# Patient Record
Sex: Female | Born: 1955 | Race: White | Hispanic: No | Marital: Married | Smoking: Never smoker
Health system: Southern US, Community
[De-identification: ages and names within clinical notes are randomized; demographics above are authoritative.]

## PROBLEM LIST (undated history)

## (undated) DIAGNOSIS — L509 Urticaria, unspecified: Secondary | ICD-10-CM

## (undated) DIAGNOSIS — L309 Dermatitis, unspecified: Secondary | ICD-10-CM

## (undated) DIAGNOSIS — E0500 Thyrotoxicosis with diffuse goiter without thyrotoxic crisis or storm: Secondary | ICD-10-CM

## (undated) HISTORY — DX: Dermatitis, unspecified: L30.9

## (undated) HISTORY — DX: Urticaria, unspecified: L50.9

## (undated) HISTORY — DX: Thyrotoxicosis with diffuse goiter without thyrotoxic crisis or storm: E05.00

## (undated) HISTORY — PX: TONSILLECTOMY: SUR1361

---

## 2017-01-23 ENCOUNTER — Encounter: Payer: Self-pay | Admitting: Allergy & Immunology

## 2017-01-23 ENCOUNTER — Ambulatory Visit (INDEPENDENT_AMBULATORY_CARE_PROVIDER_SITE_OTHER): Payer: 59 | Admitting: Allergy & Immunology

## 2017-01-23 VITALS — BP 124/70 | HR 72 | Temp 98.2°F | Resp 16 | Ht 62.0 in | Wt 157.0 lb

## 2017-01-23 DIAGNOSIS — K2960 Other gastritis without bleeding: Secondary | ICD-10-CM

## 2017-01-23 DIAGNOSIS — T39395A Adverse effect of other nonsteroidal anti-inflammatory drugs [NSAID], initial encounter: Secondary | ICD-10-CM

## 2017-01-23 DIAGNOSIS — E0500 Thyrotoxicosis with diffuse goiter without thyrotoxic crisis or storm: Secondary | ICD-10-CM

## 2017-01-23 DIAGNOSIS — K219 Gastro-esophageal reflux disease without esophagitis: Secondary | ICD-10-CM

## 2017-01-23 DIAGNOSIS — E559 Vitamin D deficiency, unspecified: Secondary | ICD-10-CM | POA: Diagnosis not present

## 2017-01-23 DIAGNOSIS — J302 Other seasonal allergic rhinitis: Secondary | ICD-10-CM

## 2017-01-23 DIAGNOSIS — D802 Selective deficiency of immunoglobulin A [IgA]: Secondary | ICD-10-CM | POA: Insufficient documentation

## 2017-01-23 NOTE — Patient Instructions (Addendum)
1. IgA deficiency - We will some labs to check your immune system: IgG, IgA, IgM, Strep pneumoniae titers, Diphtheria / Tetanus titers, Haemophilius influenzae B titers. - We will get blood work to look for evidence of environmental allergies. - We will get thyroid studies as well.  - Most patients with environmental IgA deficiency have no symptoms whatsoever.  2. Return in about 1 year (around 01/23/2018) or if symptoms worsen or fail to improve.   Please inform us of any Emergency Department visits, hospitalizations, or changes in symptoms. Call us before going to the ED for breathing or allergy symptoms since we might be able to fit you in for a sick visit. Feel free to contact us anytime with any questions, problems, or concerns.  It was a pleasure to meet you today! Enjoy the upcoming fall season!  Websites that have reliable patient information: 1. American Academy of Asthma, Allergy, and Immunology: www.aaaai.org 2. Food Allergy Research and Education (FARE): foodallergy.org 3. Mothers of Asthmatics: http://www.asthmacommunitynetwork.org 4. American College of Allergy, Asthma, and Immunology: www.acaai.org   Election Day is coming up on Tuesday, November 6th! Make your voice heard! Register to vote at vote.org!

## 2017-01-23 NOTE — Progress Notes (Signed)
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 IgA deficiency - We will some labs to check your immune system: IgG, IgA, IgM, Strep pneumoniae titers, Diphtheria/Tetanus titers, Haemophilius influenzae B titers. - We will get blood work to look for evidence of environmental allergies. - We will get thyroid studies as well.  - Most patients with environmental IgA deficiency have no symptoms whatsoever. - They are more slightly prone to autoimmunity, and Debbie Owens certainly fits the bill.   - She does have some symptoms of mild seasonal allergies, however she declined testing today since this was such a minor problem.   2. Return in about 1 year (around 01/23/2018) or if symptoms worsen or fail to improve.  Subjective:   Debbie Owens is a 61 y.o. female presenting today for evaluation of  Chief Complaint  Patient presents with  . Immunoglobulin Deficiency    Immunoglobulin A is low, per Dr. Docia Owens, her PCP. Patient does have diagnosis of graves' disease.   . Throat Clearing    constantly clears her throat. ?post nasal?    Debbie Owens has a history of the following: Patient Active Problem List   Diagnosis Date Noted  . 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 Seasonal allergic rhinitis 01/25/2017  . Graves disease 01/25/2017  . Vitamin D deficiency 01/25/2017  . Gastroesophageal reflux disease 01/25/2017  . IgA deficiency (HCC) 01/23/2017    History obtained from: chart review and patient.  Debbie Owens was referred by Debbie Bussing, MD.     Debbie Owens is a 61 y.o. female presenting for an evaluation of selective IgA deficiency.  She was  diagnosed with Grave's disease when she was 61 years old. She has undergone radioactive treatments for this. She has been on thyroid replacement since that time. One year ago in November 2017, she started having GI problems with diarrhea and stomach pain. She was on Mobic for a period of time due to plantar fasciitis. She was sent to see a gastroenterologist for a possible colonoscopy. She did have some blood work to rule out Celiac disease, which was negative. As part of the panel, the IgA was drawn which was absent (IgA level was <5 on 10/05/16 and 11/30/16). She did have a biopsy of her colon and small intestine, which was all negative. There was a polyp removed. She did have some gastritis, which was thought to be secondary to the Mobic. She stopped the Mobic and then started taking Prilosec. Her stomach is still having some intermittent "issues", but she is not sure whether this is food related.   Of note, she did see Debbie Owens at [facility] because of her history of , who felt that she only needed to follow up as needed. She does have a history of alopecia, but this has been only occurring during the recent problems with titrating her thyroid medication.   She does not have a history of sinus infections. She does have a history of recurrent Strep throat, which improved after a tonsillectomy at age 67. She has had no pneumonias, skin infections, brain infections, or other concerning infections. She does have allergic rhinitis symptoms during the fall. She will get a sneezing  fit as well as some congestion intermittently. She does not take anything regularly for this. She will use Flonase occasionally to treat bilateral frontal sinus pain, but otherwise no routine use of nasal sprays.   Otherwise, there is no history of other atopic diseases, including asthma, drug allergies, food allergies, environmental allergies, stinging insect allergies, or urticaria. There is no significant  infectious history. Vaccinations are up to date.    Past Medical History: Patient Active Problem List   Diagnosis Date Noted  . 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 Seasonal allergic rhinitis 01/25/2017  . Graves disease 01/25/2017  . Vitamin D deficiency 01/25/2017  . Gastroesophageal reflux disease 01/25/2017  . IgA deficiency (HCC) 01/23/2017    Medication List:  Allergies as of 01/23/2017      Reactions   Codeine Hives      Medication List       Accurate as of 01/23/17 11:59 PM. Always use your most recent med list.          BIOTIN PO Take by mouth.   cholecalciferol 1000 units tablet Commonly known as:  VITAMIN D Take 1,000 Units by mouth daily.   omeprazole 20 MG tablet Commonly known as:  PRILOSEC OTC Take 20 mg by mouth daily.   REFRESH OPTIVE OP 1 drop 3 (three) times daily as needed.   SYNTHROID 50 MCG tablet Generic drug:  levothyroxine            Discharge Care Instructions        Start     Ordered   01/23/17 0000  IgG, IgA, IgM     01/23/17 1514   01/23/17 0000  Strep pneumoniae 23 Serotypes IgG     01/23/17 1514   01/23/17 0000  Diphtheria / Tetanus Antibody Panel     01/23/17 1514   01/23/17 0000  Haemophilius influenzae B Ab IgG     01/23/17 1514   01/23/17 0000  Thyroid Panel With TSH     01/23/17 1514   01/23/17 0000  IgE     01/23/17 1514   01/23/17 0000  Allergens Zone 2     01/23/17 1514      Birth History: non-contributory.  Developmental History: non-contributory.   Past Surgical History: Past Surgical History:  Procedure Laterality Date  . TONSILLECTOMY    . VAGINAL DELIVERY     3x     Family History: Family History  Problem Relation Age of Onset  . Diabetes Mother   . Thyroid disease Mother   . Atrial fibrillation Mother   . COPD Father   . Hyperlipidemia Father   . Diabetes Brother   . Hypertension Brother      Social History: Debbie Owens lives at home with her  husband. Her husband is a retired Emergency planning/management officer and she works currently as a Clinical biochemist. She did live in [place] and moved back to [place] 2.5 years ago. 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 She has been working there for one year. They live in a 59yo home with hardwoods in the main living areas and rugs in the bedroom. They have gas heating and central cooling. There is a cat in the home. She is unsure whether there are dust mite coverings on the bedding. There is no tobacco exposure and she has never smoked .    Review of Systems: a 14-point review of systems is pertinent for what  is mentioned in HPI.  Otherwise, all other systems were negative. Constitutional: negative other than that listed in the HPI Eyes: negative other than that listed in the HPI Ears, nose, mouth, throat, and face: negative other than that listed in the HPI Respiratory: negative other than that listed in the HPI Cardiovascular: negative other than that listed in the HPI Gastrointestinal: negative other than that listed in the HPI Genitourinary: negative other than that listed in the HPI Integument: negative other than that listed in the HPI Hematologic: negative other than that listed in the HPI Musculoskeletal: negative other than that listed in the HPI Neurological: negative other than that listed in the HPI Allergy/Immunologic: negative other than that listed in the HPI    Objective:   Blood pressure 124/70, pulse 72, temperature 98.2 F (36.8 C), temperature source Oral, resp. rate 16, height  (1.575 m), weight 157 lb (71.2 kg), SpO2 99 %. Body mass index is 28.72 kg/m.   Physical Exam:  General: Alert, interactive, in no acute distress. Pleasant interactive female.  Eyes: No conjunctival injection present on the right, No conjunctival injection present on the left, PERRL bilaterally, No discharge on the right, No  discharge on the left and No Horner-Trantas dots present Ears: Right TM pearly gray with normal light reflex, Left TM pearly gray with normal light reflex, Right TM intact without perforation and Left TM intact without perforation.  Nose/Throat: External nose within normal limits and septum midline, turbinates edematous and pale with clear discharge, post-pharynx erythematous without cobblestoning in the posterior oropharynx. Tonsils 2+ without exudates Neck: Supple without thyromegaly.  Adenopathy: shoddy bilateral anterior cervical lymphadenopathy. and no enlarged lymph nodes appreciated in the occipital, axillary, epitrochlear, inguinal, or popliteal regions. Lungs: Clear to auscultation without wheezing, rhonchi or rales. No increased work of breathing. CV: Normal S1/S2, no murmurs. Capillary refill <2 seconds.  Abdomen: Nondistended, nontender. No guarding or rebound tenderness. Bowel sounds present in all fields and hyperactive  Skin: Warm and dry, without lesions or rashes. Extremities:  No clubbing, cyanosis or edema. Neuro:   Grossly intact. No focal deficits appreciated. Responsive to questions.  Diagnostic studies: none      Malachi Bonds, MD [facility]

## 2017-01-25 DIAGNOSIS — E0500 Thyrotoxicosis with diffuse goiter without thyrotoxic crisis or storm: Secondary | ICD-10-CM | POA: Insufficient documentation

## 2017-01-25 DIAGNOSIS — K2960 Other gastritis without bleeding: Secondary | ICD-10-CM | POA: Insufficient documentation

## 2017-01-25 DIAGNOSIS — T39395A Adverse effect of other nonsteroidal anti-inflammatory drugs [NSAID], initial encounter: Secondary | ICD-10-CM

## 2017-01-25 DIAGNOSIS — E559 Vitamin D deficiency, unspecified: Secondary | ICD-10-CM | POA: Insufficient documentation

## 2017-01-25 DIAGNOSIS — J302 Other seasonal allergic rhinitis: Secondary | ICD-10-CM | POA: Insufficient documentation

## 2017-01-25 DIAGNOSIS — K219 Gastro-esophageal reflux disease without esophagitis: Secondary | ICD-10-CM | POA: Insufficient documentation

## 2017-01-27 LAB — IGE+ALLERGENS ZONE 2(30)
Alternaria Alternata IgE: <0.1 kU/L
Bermuda Grass IgE: <0.1 kU/L
Cedar, Mountain IgE: <0.1 kU/L
Cockroach, American IgE: <0.1 kU/L
Common Silver Birch IgE: <0.1 kU/L
D Pteronyssinus IgE: <0.1 kU/L
Dog Dander IgE: <0.1 kU/L
IGE (IMMUNOGLOBULIN E), SERUM: 6 [IU]/mL (ref 0–100)
Johnson Grass IgE: <0.1 kU/L
Maple/Box Elder IgE: <0.1 kU/L
Mucor Racemosus IgE: <0.1 kU/L
Mugwort IgE Qn: <0.1 kU/L
Oak, White IgE: <0.1 kU/L
Plantain, English IgE: <0.1 kU/L
Ragweed, Short IgE: 1.38 kU/L — AB
Sweet gum IgE RAST Ql: <0.1 kU/L
Timothy Grass IgE: <0.1 kU/L

## 2017-01-27 LAB — STREP PNEUMONIAE 23 SEROTYPES IGG
PNEUMO AB TYPE 17 (17F): 0.1 ug/mL — AB (ref >1.3)
PNEUMO AB TYPE 34 (10A): 1.4 ug/mL (ref >1.3)
PNEUMO AB TYPE 4: 0.4 ug/mL — AB (ref >1.3)
PNEUMO AB TYPE 54 (15B): 0.2 ug/mL — AB (ref >1.3)
PNEUMO AB TYPE 68 (9V): 0.6 ug/mL — AB (ref >1.3)
PNEUMO AB TYPE 70 (33F): 0.4 ug/mL — AB (ref >1.3)
PNEUMO AB TYPE 8: 0.1 ug/mL — AB (ref >1.3)
Pneumo Ab Type 1*: 2.3 ug/mL (ref >1.3)
Pneumo Ab Type 12 (12F)*: <0.1 ug/mL — ABNORMAL LOW (ref >1.3)
Pneumo Ab Type 14*: 0.2 ug/mL — ABNORMAL LOW (ref >1.3)
Pneumo Ab Type 19 (19F)*: 0.3 ug/mL — ABNORMAL LOW (ref >1.3)
Pneumo Ab Type 2*: <0.1 ug/mL — ABNORMAL LOW (ref >1.3)
Pneumo Ab Type 20*: 0.4 ug/mL — ABNORMAL LOW (ref >1.3)
Pneumo Ab Type 23 (23F)*: <0.1 ug/mL — ABNORMAL LOW (ref >1.3)
Pneumo Ab Type 3*: <0.1 ug/mL — ABNORMAL LOW (ref >1.3)
Pneumo Ab Type 5*: <0.2 ug/mL — ABNORMAL LOW (ref >1.3)
Pneumo Ab Type 51 (7F)*: <0.1 ug/mL — ABNORMAL LOW (ref >1.3)
Pneumo Ab Type 56 (18C)*: <0.1 ug/mL — ABNORMAL LOW (ref >1.3)
Pneumo Ab Type 57 (19A)*: 0.9 ug/mL — ABNORMAL LOW (ref >1.3)

## 2017-01-27 LAB — THYROID PANEL WITH TSH
FREE THYROXINE INDEX: 2.4 (ref 1.2–4.9)
T3 UPTAKE RATIO: 27 % (ref 24–39)
T4 TOTAL: 8.9 ug/dL (ref 4.5–12.0)
TSH: 1.17 u[IU]/mL (ref 0.450–4.500)

## 2017-01-27 LAB — HAEMOPHILIUS INFLUENZAE B AB IGG: INFLUENZA B VIRUS AB, IGG: 0.61 ug/mL

## 2017-01-27 LAB — DIPHTHERIA / TETANUS ANTIBODY PANEL
Diphtheria Ab: 0.21 IU/mL (ref <0.10)
Tetanus Ab, IgG: 4.3 IU/mL (ref <0.10)

## 2017-01-27 LAB — IGG, IGA, IGM
IGM (IMMUNOGLOBULIN M), SRM: 57 mg/dL (ref 26–217)
IgA/Immunoglobulin A, Serum: <5 mg/dL — ABNORMAL LOW (ref 87–352)
IgG (Immunoglobin G), Serum: 1678 mg/dL — ABNORMAL HIGH (ref 700–1600)

## 2017-01-30 ENCOUNTER — Telehealth: Payer: Self-pay | Admitting: Allergy & Immunology

## 2017-01-30 NOTE — Telephone Encounter (Signed)
Dr. Gallagher please advise.  

## 2017-01-30 NOTE — Telephone Encounter (Signed)
Patient has had some labs drawn She is wondering if any part of them have come back - she was told that some results may take longer than others She is concerned about her thyroid  Please call to answer any questions

## 2017-01-30 NOTE — Telephone Encounter (Signed)
Dr. Docia Chuck at [facility] @ [place]. [phone]

## 2017-01-30 NOTE — Telephone Encounter (Signed)
I called patient on mobile number. Advised her that all results are not back yet and the doctor has not released his notes yet. I told her that as soon as this is done we will call her to notify her of results and any recommendations he has. She did want me to make a note that her thyroid panel would need to be sent to her pcp at [facility]. I will find his name and contact info to attach.

## 2017-04-28 ENCOUNTER — Encounter (HOSPITAL_COMMUNITY): Payer: Self-pay | Admitting: Emergency Medicine

## 2017-04-28 ENCOUNTER — Emergency Department (HOSPITAL_COMMUNITY): Payer: BLUE CROSS/BLUE SHIELD

## 2017-04-28 ENCOUNTER — Other Ambulatory Visit: Payer: Self-pay

## 2017-04-28 DIAGNOSIS — R05 Cough: Secondary | ICD-10-CM | POA: Insufficient documentation

## 2017-04-28 DIAGNOSIS — R531 Weakness: Secondary | ICD-10-CM | POA: Insufficient documentation

## 2017-04-28 DIAGNOSIS — Z5321 Procedure and treatment not carried out due to patient leaving prior to being seen by health care provider: Secondary | ICD-10-CM | POA: Diagnosis not present

## 2017-04-28 DIAGNOSIS — R079 Chest pain, unspecified: Secondary | ICD-10-CM | POA: Diagnosis present

## 2017-04-28 LAB — COMPREHENSIVE METABOLIC PANEL
ALBUMIN: 3.8 g/dL (ref 3.5–5.0)
ALT: 20 U/L (ref 14–54)
ANION GAP: 11 (ref 5–15)
AST: 26 U/L (ref 15–41)
Alkaline Phosphatase: 85 U/L (ref 38–126)
BUN: 9 mg/dL (ref 6–20)
CHLORIDE: 104 mmol/L (ref 101–111)
CO2: 25 mmol/L (ref 22–32)
Calcium: 9 mg/dL (ref 8.9–10.3)
Creatinine, Ser: 0.71 mg/dL (ref 0.44–1.00)
GFR calc non Af Amer: >60 mL/min (ref >60)
Glucose, Bld: 116 mg/dL — ABNORMAL HIGH (ref 65–99)
POTASSIUM: 3.7 mmol/L (ref 3.5–5.1)
SODIUM: 140 mmol/L (ref 135–145)
Total Bilirubin: 0.4 mg/dL (ref 0.3–1.2)
Total Protein: 7.5 g/dL (ref 6.5–8.1)

## 2017-04-28 LAB — CBC WITH DIFFERENTIAL/PLATELET
BASOS PCT: 0 %
Basophils Absolute: 0 10*3/uL (ref 0.0–0.1)
EOS ABS: 0.3 10*3/uL (ref 0.0–0.7)
EOS PCT: 4 %
HCT: 40.9 % (ref 36.0–46.0)
Hemoglobin: 13.1 g/dL (ref 12.0–15.0)
LYMPHS ABS: 1.8 10*3/uL (ref 0.7–4.0)
Lymphocytes Relative: 20 %
MCH: 29.6 pg (ref 26.0–34.0)
MCHC: 32 g/dL (ref 30.0–36.0)
MCV: 92.3 fL (ref 78.0–100.0)
MONOS PCT: 6 %
Monocytes Absolute: 0.5 10*3/uL (ref 0.1–1.0)
Neutro Abs: 6 10*3/uL (ref 1.7–7.7)
Neutrophils Relative %: 70 %
PLATELETS: 238 10*3/uL (ref 150–400)
RBC: 4.43 MIL/uL (ref 3.87–5.11)
RDW: 13.7 % (ref 11.5–15.5)
WBC: 8.6 10*3/uL (ref 4.0–10.5)

## 2017-04-28 LAB — I-STAT TROPONIN, ED: TROPONIN I, POC: 0 ng/mL (ref 0.00–0.08)

## 2017-04-28 NOTE — ED Triage Notes (Signed)
Patient presents with multiple complaints : chest congestion , chest pressure/pain worse with deep inspiration , productive cough ,mild SOB , nasal congestion , right ear ache and right upper back pain , fever/chills onset last week .

## 2017-04-29 ENCOUNTER — Emergency Department (HOSPITAL_COMMUNITY)
Admission: EM | Admit: 2017-04-29 | Discharge: 2017-04-29 | Disposition: A | Discharge status: Home / Self Care | Payer: BLUE CROSS/BLUE SHIELD | Attending: Emergency Medicine | Admitting: Emergency Medicine

## 2017-04-29 NOTE — ED Notes (Signed)
LWBS 

## 2017-06-23 ENCOUNTER — Other Ambulatory Visit: Payer: Self-pay | Admitting: Family Medicine

## 2017-06-23 DIAGNOSIS — R109 Unspecified abdominal pain: Secondary | ICD-10-CM

## 2017-07-10 ENCOUNTER — Ambulatory Visit
Admission: RE | Admit: 2017-07-10 | Discharge: 2017-07-10 | Disposition: A | Discharge status: Home / Self Care | Payer: No Typology Code available for payment source | Source: Ambulatory Visit | Attending: Family Medicine | Admitting: Family Medicine

## 2017-07-10 DIAGNOSIS — R109 Unspecified abdominal pain: Secondary | ICD-10-CM

## 2017-07-24 ENCOUNTER — Ambulatory Visit
Admission: RE | Admit: 2017-07-24 | Discharge: 2017-07-24 | Disposition: A | Discharge status: Home / Self Care | Payer: No Typology Code available for payment source | Source: Ambulatory Visit | Attending: Family Medicine | Admitting: Family Medicine

## 2017-07-24 ENCOUNTER — Other Ambulatory Visit: Payer: Self-pay | Admitting: Family Medicine

## 2017-07-24 DIAGNOSIS — R52 Pain, unspecified: Secondary | ICD-10-CM

## 2020-01-26 IMAGING — CT CT ABD-PELV W/O CM
1 of 2 series · 14 of 32 positions shown, 19 images · non-contrast
Comparison: None.

CLINICAL DATA: 61-year-old female with right lower quadrant
pain/right upper flank pain. No history cancer. Initial encounter.

EXAM:
CT ABDOMEN AND PELVIS WITHOUT CONTRAST
TECHNIQUE: Multidetector CT imaging of the abdomen and pelvis was performed
following the standard protocol without IV contrast.

[Series 2: renal standard/full · axial · 0.72mm/px · z∈[-319,+46]mm · 14 of 83 slices shown, 19 images]
[im 5/83  soft-tissue]
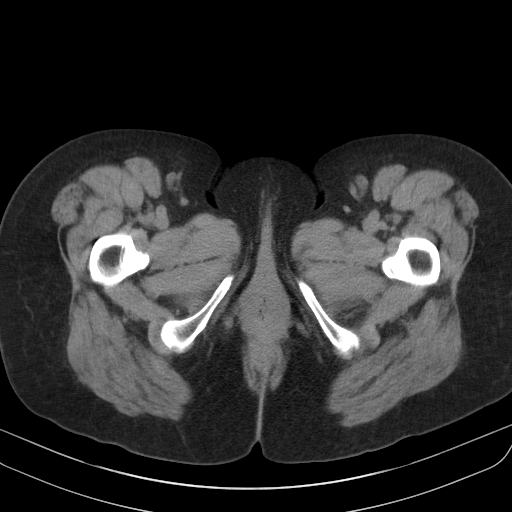
[im 5/83  bone]
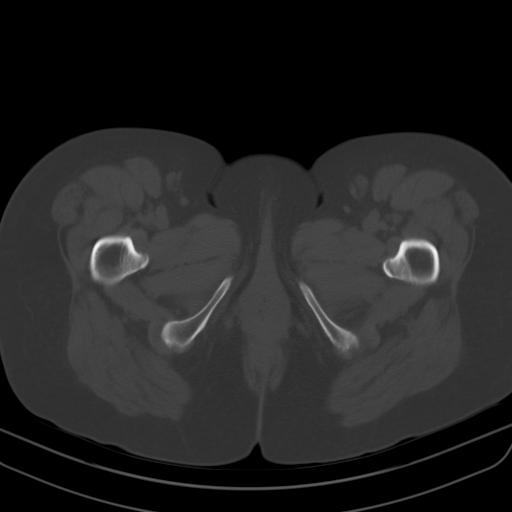
[im 13/83  soft-tissue]
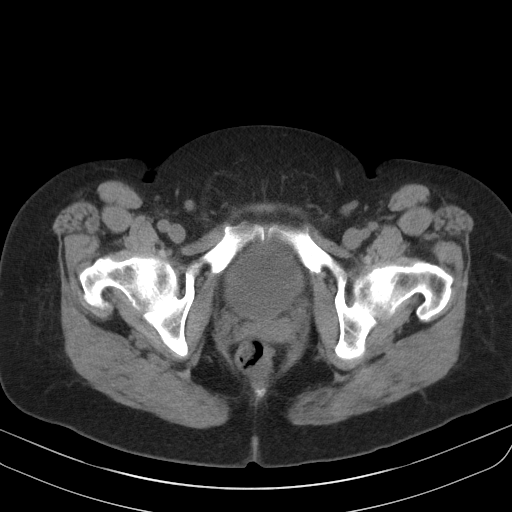
[im 17/83  soft-tissue]
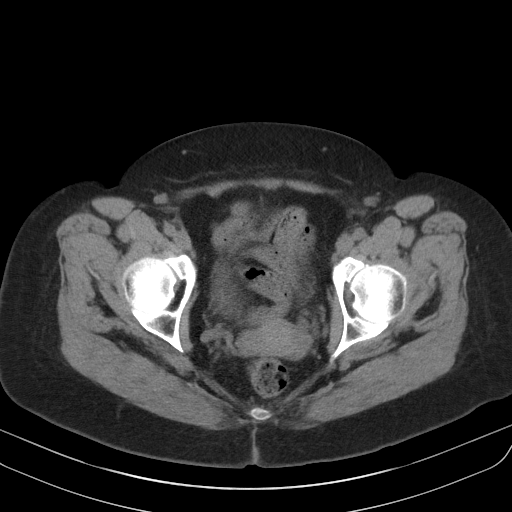
[im 25/83  soft-tissue]
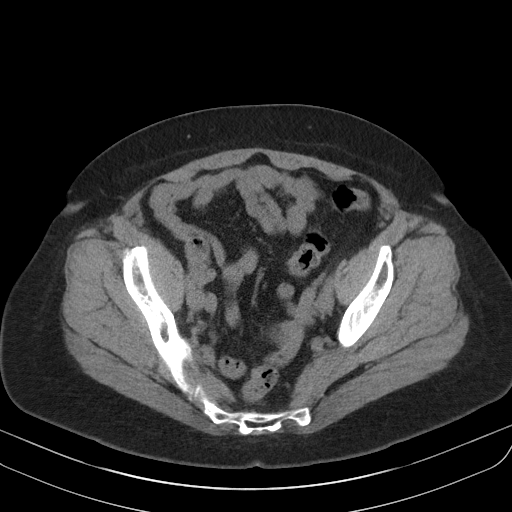
[im 29/83  soft-tissue]
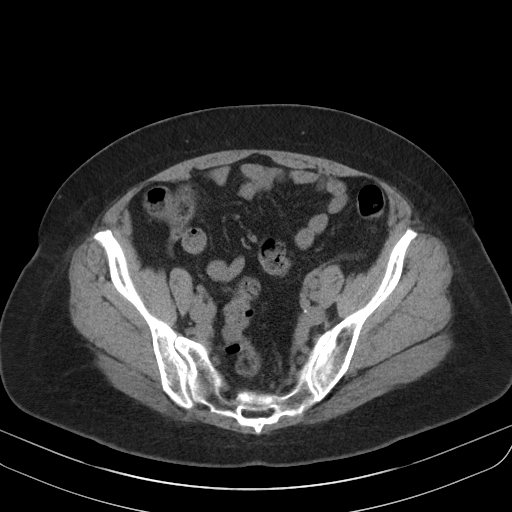
[im 37/83  soft-tissue]
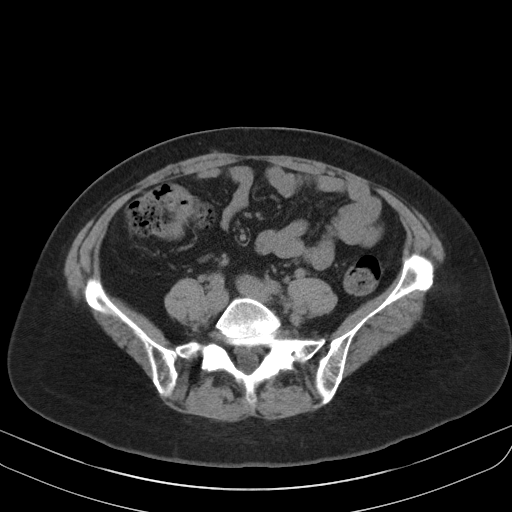
[im 42/83  soft-tissue]
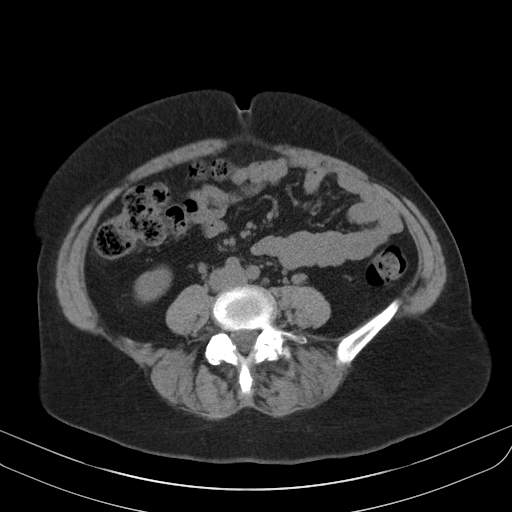
[im 46/83  soft-tissue]
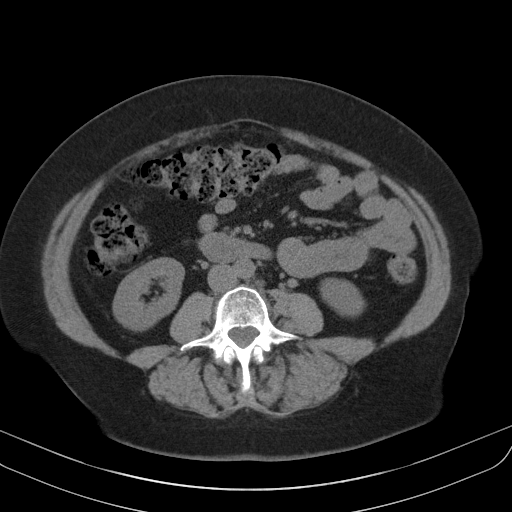
[im 54/83  soft-tissue]
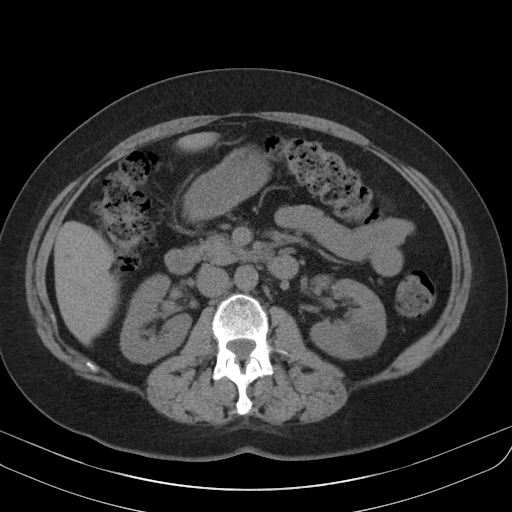
[im 54/83  bone]
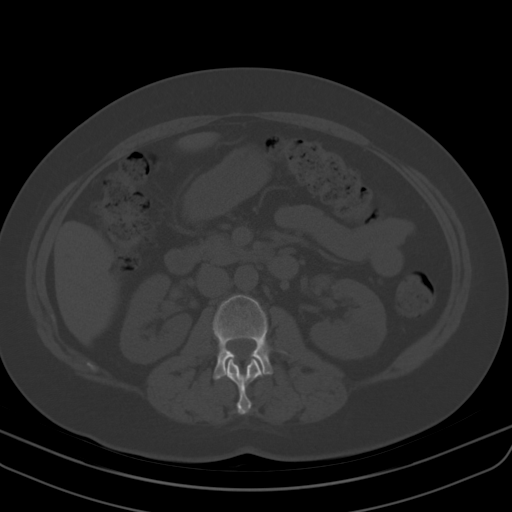
[im 58/83  soft-tissue]
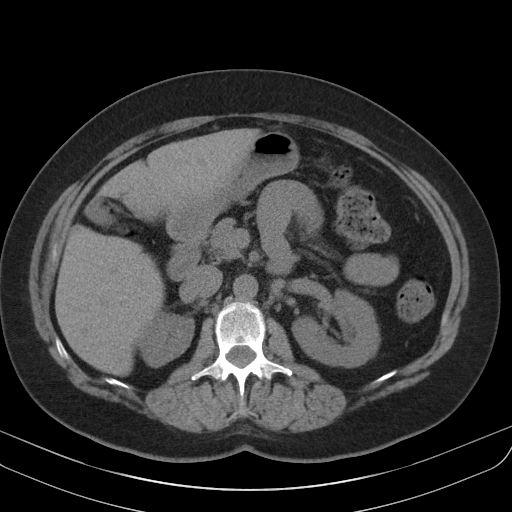
[im 66/83  soft-tissue]
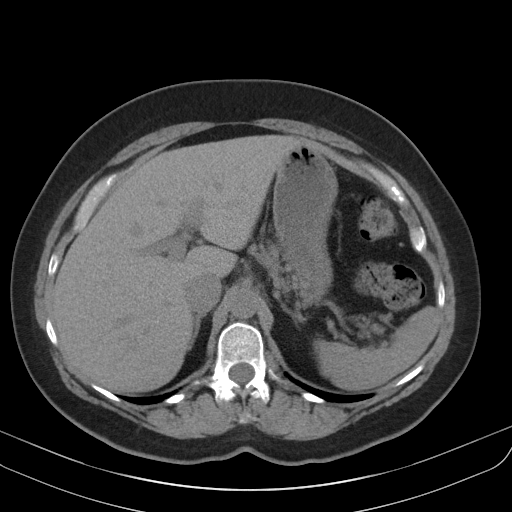
[im 66/83  lung]
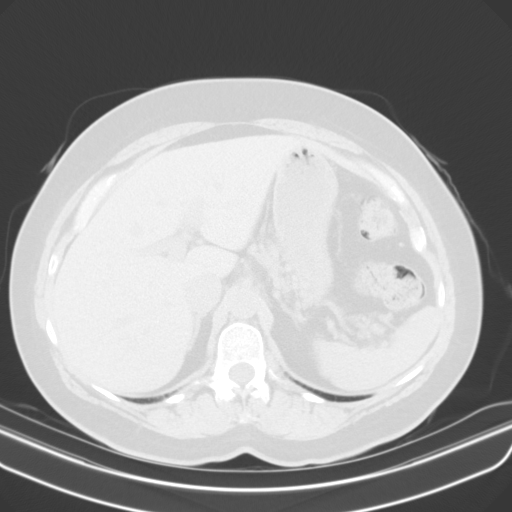
[im 70/83  soft-tissue]
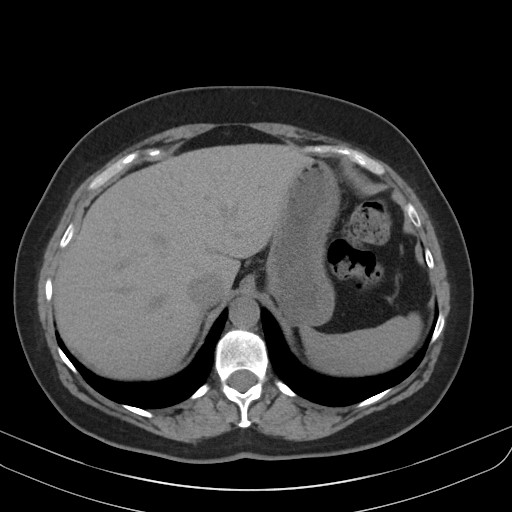
[im 70/83  lung]
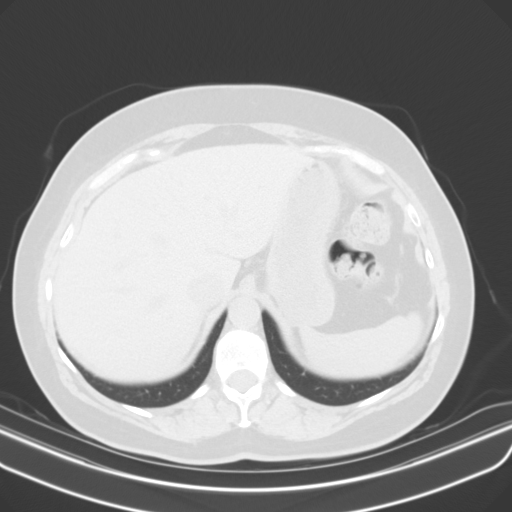
[im 74/83  lung]
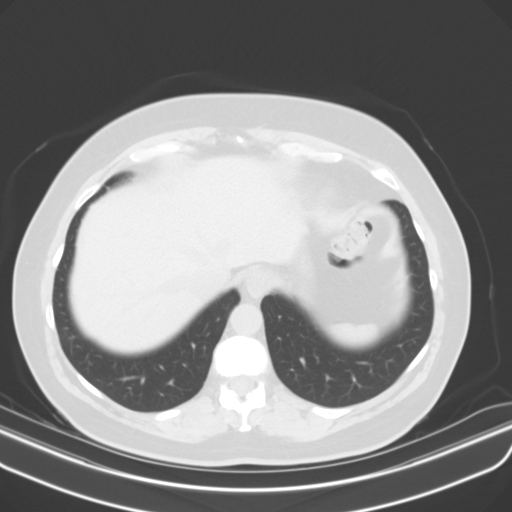
[im 78/83  soft-tissue]
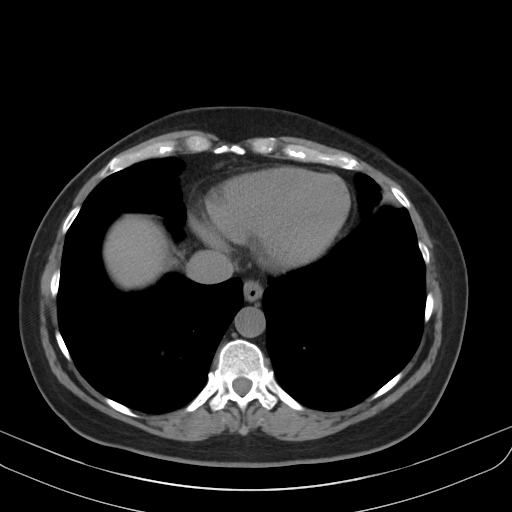
[im 78/83  lung]
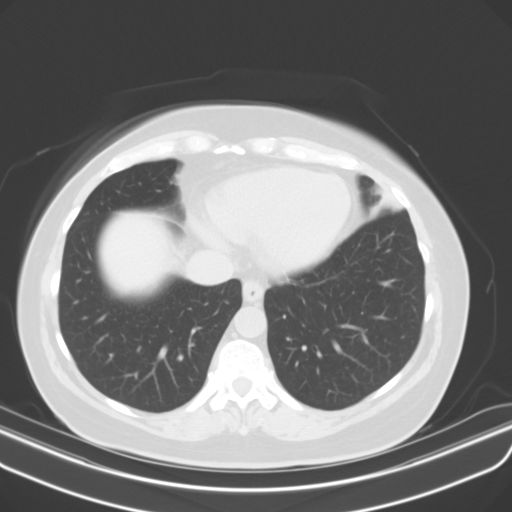

[14 of 32 positions shown; findings below may reference images not displayed]

FINDINGS: Lower chest: Minimal scarring lung bases. Heart size within normal
limits. Question very small hiatal hernia.

Hepatobiliary: Taking into account limitation by non contrast
imaging, no worrisome hepatic lesion. Contracted gallbladder.

Pancreas: Taking into account limitation by non contrast imaging, no
worrisome pancreatic mass or inflammation.

Spleen: Taking into account limitation by non contrast imaging, no
splenic mass or enlargement.

Adrenals/Urinary Tract: No obstructing stone or hydronephrosis.
cm left renal cyst. Taking into account limitation by non contrast
imaging, no worrisome renal or adrenal mass.

Partially contracted urinary bladder.  No gross abnormality noted.

Stomach/Bowel: No bowel inflammatory process or obvious mass noted.
Specifically, no inflammation surrounds the appendix or terminal
ileum.

Vascular/Lymphatic: No aortic aneurysm.

Scattered small lymph nodes including small lymph nodes right lower
quadrant region and retroperitoneal region without adenopathy.

Reproductive: No worrisome adnexal or uterine abnormality.

Other: No free air or bowel containing hernia.

Musculoskeletal: L5-S1 facet degenerative changes greater on left.
Mild right sacroiliac joint degenerative changes.
IMPRESSION: No abnormality detected as cause of patient's right lower quadrant
pain/right flank pain.

Contracted gallbladder.

2.8 cm left renal cyst.

L5-S1 facet degenerative changes greater on left.

Mild right sacroiliac joint degenerative changes.

## 2020-02-09 IMAGING — CR DG HIP (WITH OR WITHOUT PELVIS) 2-3V*R*
2 series · 2 of 2 positions shown · non-contrast
Comparison: 07/10/2017 CT.

CLINICAL DATA: 61-year-old female with right groin pain for the
past month. Initial encounter.

EXAM:
DG HIP (WITH OR WITHOUT PELVIS) 2-3V RIGHT

[w hip ap right]
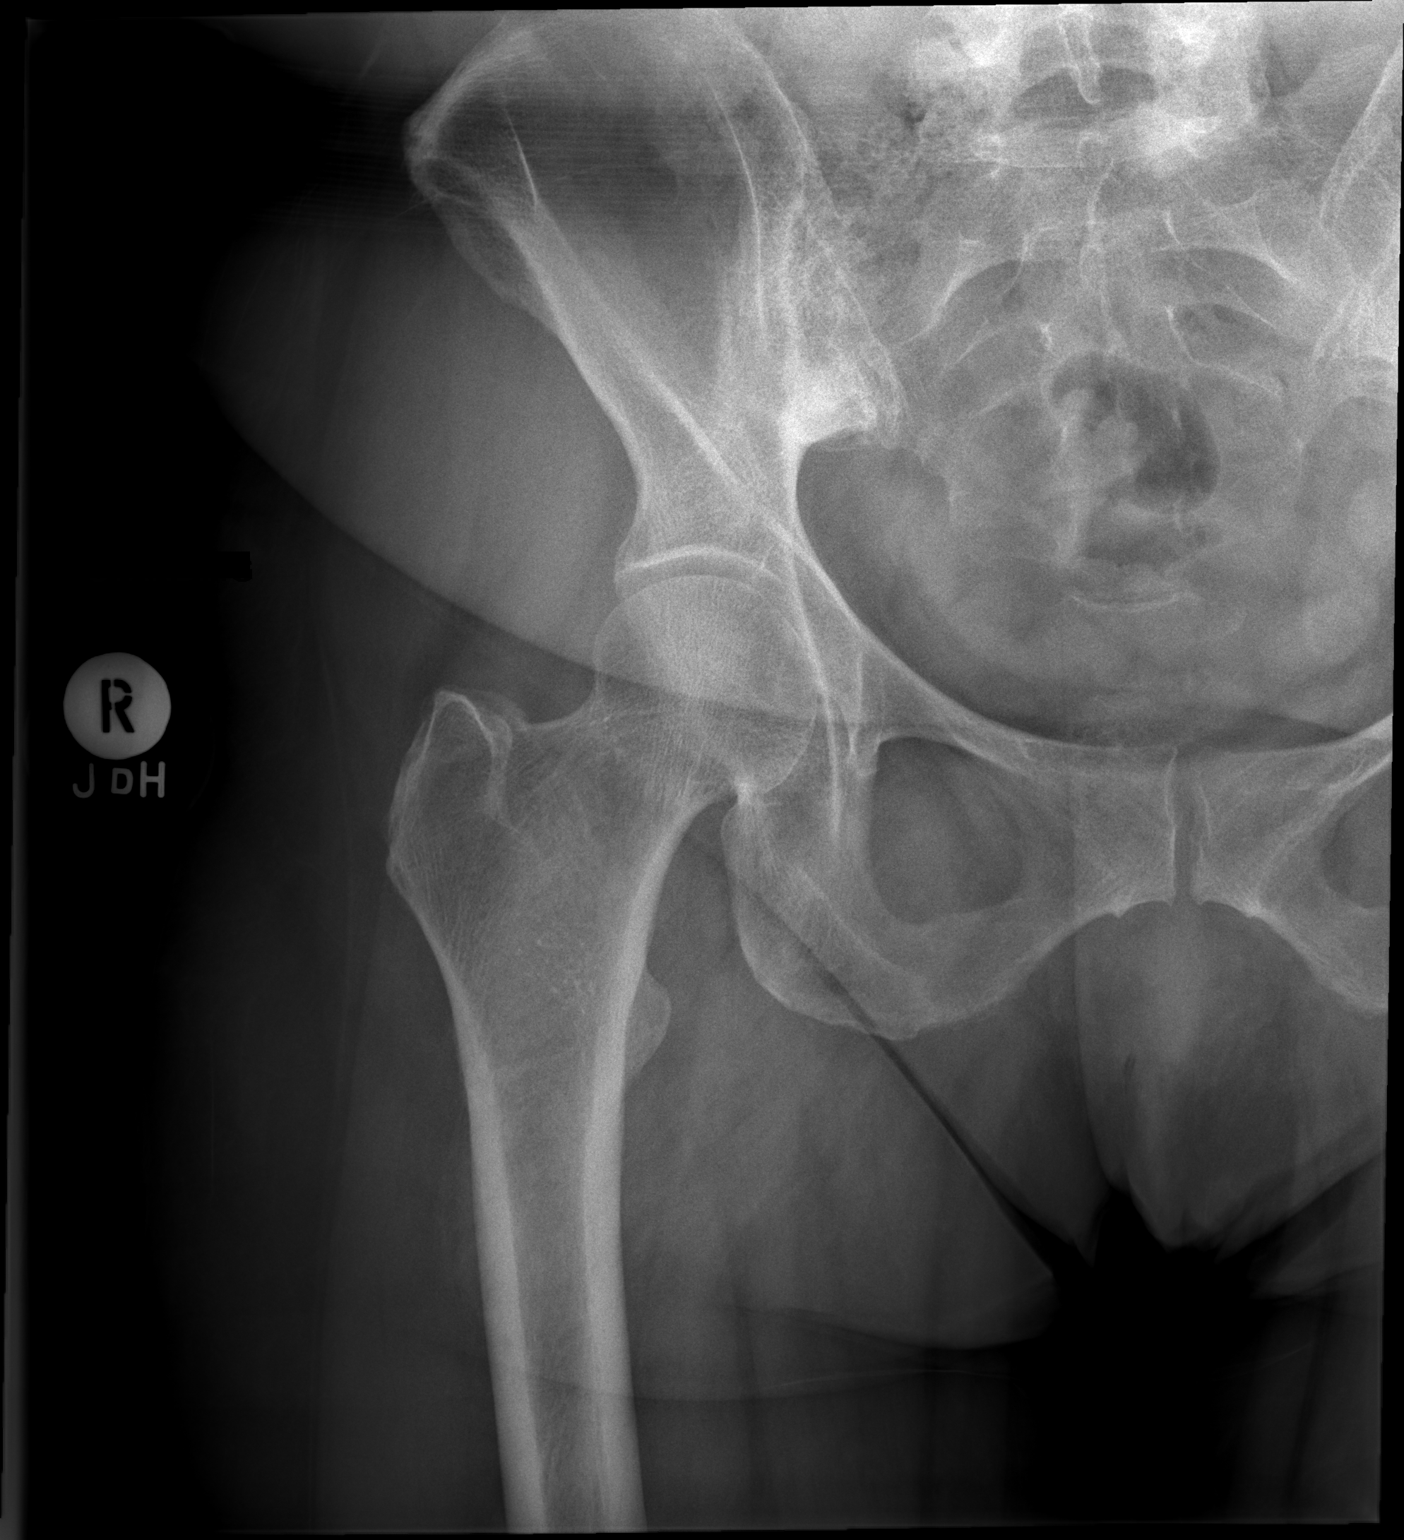

[w hip frog right]
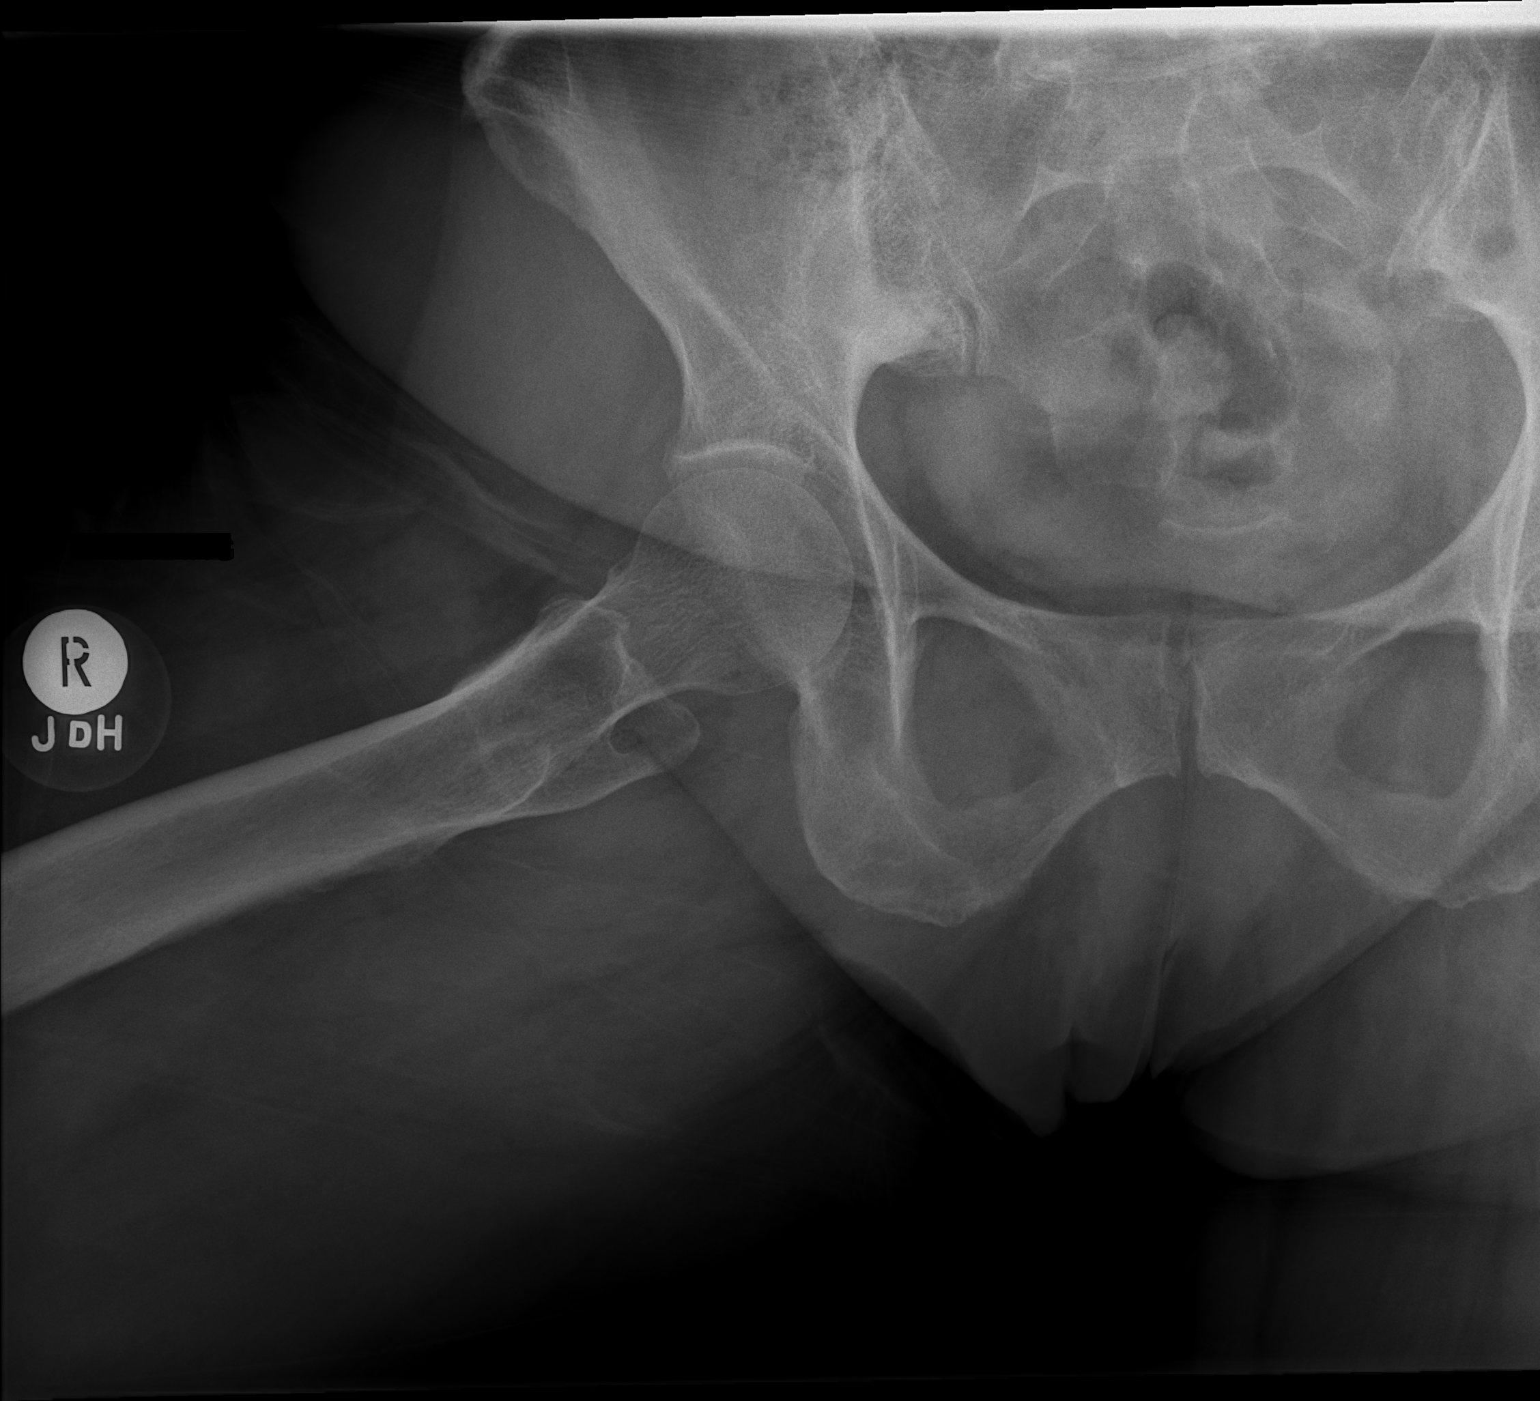

[2 of 2 positions shown; findings below may reference images not displayed]

FINDINGS: No evidence of right hip joint degenerative changes, femoral head
avascular necrosis, fracture or dislocation.

Sclerotic appearance right ilium suggestive of mild right sacroiliac
joint degenerative changes.
IMPRESSION: Mild right sacroiliac joint degenerative changes.

Right hip unremarkable.

## 2020-03-12 ENCOUNTER — Other Ambulatory Visit (HOSPITAL_COMMUNITY): Payer: Self-pay | Admitting: Family Medicine

## 2020-03-12 DIAGNOSIS — R0602 Shortness of breath: Secondary | ICD-10-CM

## 2020-04-03 ENCOUNTER — Other Ambulatory Visit: Payer: Self-pay

## 2020-04-03 ENCOUNTER — Ambulatory Visit (HOSPITAL_COMMUNITY): Payer: BLUE CROSS/BLUE SHIELD | Attending: Cardiovascular Disease

## 2020-04-03 DIAGNOSIS — R0602 Shortness of breath: Secondary | ICD-10-CM | POA: Diagnosis present

## 2020-04-03 LAB — ECHOCARDIOGRAM COMPLETE
Area-P 1/2: 3.31 cm2
S' Lateral: 2.9 cm

## 2021-12-31 ENCOUNTER — Ambulatory Visit: Payer: No Typology Code available for payment source | Admitting: Podiatry

## 2022-01-10 ENCOUNTER — Encounter: Payer: Self-pay | Admitting: Podiatry

## 2022-01-10 ENCOUNTER — Ambulatory Visit: Payer: No Typology Code available for payment source | Admitting: Podiatry

## 2022-01-10 DIAGNOSIS — L603 Nail dystrophy: Secondary | ICD-10-CM

## 2022-01-10 NOTE — Progress Notes (Signed)
  Subjective:  Patient ID: Debbie Owens, female    DOB: 1955/09/01,   MRN: [id number]  Chief Complaint  Patient presents with   Nail Problem    Bil great toenail thick. Patient wants to know if there is anything that she can do to improve the look of her Great toenails. Left toenail came off about 5 weeks ago.    66 y.o. female presents for concern of bilateral thick toenails mostly the great toenails. Wondering what can be done. Denies treatments. Relates the left toenail cam off about 5 weeks ago. Relates trauma in the past to the nails.  . Denies any other pedal complaints. Denies n/v/f/c.   Past Medical History:  Diagnosis Date   Eczema    Graves disease    Urticaria     Objective:  Physical Exam: Vascular: DP/PT pulses 2/4 bilateral. CFT <3 seconds. Normal hair growth on digits. No edema.  Skin. No lacerations or abrasions bilateral feet. Bilateral hallux nails are thickened and discolored with splitting noted on the left.  Musculoskeletal: MMT 5/5 bilateral lower extremities in DF, PF, Inversion and Eversion. Deceased ROM in DF of ankle joint.  Neurological: Sensation intact to light touch.   Assessment:   1. Onychodystrophy      Plan:  Patient was evaluated and treated and all questions answered. -Examined patient -Discussed treatment options for painful dystrophic nails  -Clinical picture and Fungal culture was obtained by removing a portion of the hard nail itself from each of the involved toenails using a sterile nail nipper and sent to [facility] lab. Patient tolerated the biopsy procedure well without discomfort or need for anesthesia.  -Discussed use of urea nail gel.  -Patient to return in 4 weeks for follow up evaluation and discussion of fungal culture results or sooner if symptoms worsen.   Louann Sjogren, DPM

## 2022-01-17 ENCOUNTER — Telehealth: Payer: Self-pay | Admitting: *Deleted

## 2022-01-17 NOTE — Telephone Encounter (Signed)
Debbie Owens is asking for the requisition form for patient's nail samples that were sent in 01/10/22. Please advise/fax to : [phone].

## 2022-01-20 ENCOUNTER — Other Ambulatory Visit: Payer: Self-pay

## 2022-01-20 ENCOUNTER — Emergency Department (HOSPITAL_BASED_OUTPATIENT_CLINIC_OR_DEPARTMENT_OTHER)
Admission: EM | Admit: 2022-01-20 | Discharge: 2022-01-20 | Disposition: A | Discharge status: Home / Self Care | Payer: BLUE CROSS/BLUE SHIELD | Attending: Emergency Medicine | Admitting: Emergency Medicine

## 2022-01-20 ENCOUNTER — Other Ambulatory Visit (HOSPITAL_BASED_OUTPATIENT_CLINIC_OR_DEPARTMENT_OTHER): Payer: Self-pay

## 2022-01-20 ENCOUNTER — Emergency Department (HOSPITAL_BASED_OUTPATIENT_CLINIC_OR_DEPARTMENT_OTHER): Payer: BLUE CROSS/BLUE SHIELD

## 2022-01-20 ENCOUNTER — Encounter (HOSPITAL_BASED_OUTPATIENT_CLINIC_OR_DEPARTMENT_OTHER): Payer: Self-pay

## 2022-01-20 DIAGNOSIS — R079 Chest pain, unspecified: Secondary | ICD-10-CM

## 2022-01-20 DIAGNOSIS — R072 Precordial pain: Secondary | ICD-10-CM | POA: Diagnosis present

## 2022-01-20 DIAGNOSIS — R799 Abnormal finding of blood chemistry, unspecified: Secondary | ICD-10-CM | POA: Insufficient documentation

## 2022-01-20 LAB — BASIC METABOLIC PANEL
Anion gap: 7 (ref 5–15)
BUN: 17 mg/dL (ref 8–23)
CO2: 27 mmol/L (ref 22–32)
Calcium: 10.4 mg/dL — ABNORMAL HIGH (ref 8.9–10.3)
Chloride: 107 mmol/L (ref 98–111)
Creatinine, Ser: 0.73 mg/dL (ref 0.44–1.00)
GFR, Estimated: >60 mL/min (ref >60)
Glucose, Bld: 91 mg/dL (ref 70–99)
Potassium: 4.2 mmol/L (ref 3.5–5.1)
Sodium: 141 mmol/L (ref 135–145)

## 2022-01-20 LAB — CBC
HCT: 42.4 % (ref 36.0–46.0)
Hemoglobin: 14.1 g/dL (ref 12.0–15.0)
MCH: 30.4 pg (ref 26.0–34.0)
MCHC: 33.3 g/dL (ref 30.0–36.0)
MCV: 91.4 fL (ref 80.0–100.0)
Platelets: 221 10*3/uL (ref 150–400)
RBC: 4.64 MIL/uL (ref 3.87–5.11)
RDW: 13.1 % (ref 11.5–15.5)
WBC: 5.1 10*3/uL (ref 4.0–10.5)
nRBC: 0 % (ref 0.0–0.2)

## 2022-01-20 LAB — TROPONIN I (HIGH SENSITIVITY)
Troponin I (High Sensitivity): <2 ng/L (ref <18)
Troponin I (High Sensitivity): <2 ng/L (ref <18)

## 2022-01-20 LAB — D-DIMER, QUANTITATIVE: D-Dimer, Quant: 0.34 ug/mL-FEU (ref 0.00–0.50)

## 2022-01-20 LAB — CBG MONITORING, ED: Glucose-Capillary: 91 mg/dL (ref 70–99)

## 2022-01-20 MED ORDER — NAPROXEN 375 MG PO TABS
375.0000 mg | ORAL_TABLET | Freq: Two times a day (BID) | ORAL | 0 refills | Status: AC
  Filled 2022-01-20: qty 20, 10d supply, fill #0

## 2022-01-20 MED ORDER — ASPIRIN 81 MG PO CHEW
324.0000 mg | CHEWABLE_TABLET | Freq: Once | ORAL | Status: AC
  Administered 2022-01-20: 324 mg via ORAL
  Filled 2022-01-20: qty 4

## 2022-01-20 NOTE — ED Triage Notes (Signed)
Pt has had left shoulder pain for a while, had seen orthopedist, who gave her exercises to do. Pt states that did not complete resolve the pain, and now pain goes to her left breast. Pt states the stabbing chest pain started last night. Denies SHOB.

## 2022-01-20 NOTE — ED Provider Notes (Signed)
[place] EMERGENCY DEPT Provider Note   CSN: [id number] Arrival date & time: 01/20/22  1006     History  Chief Complaint  Patient presents with   Chest Pain    Debbie Owens is a 66 y.o. female.   Chest Pain Pain location:  Substernal area Pain quality: sharp and stabbing   Pain radiates to:  L arm Pain severity:  Severe Onset quality:  Sudden Duration: several weeks, worse last couple of days, especially last night. Timing:  Intermittent Context: not breathing, not movement and not raising an arm   Worsened by:  Nothing Ineffective treatments: took a course of steroids from orthopedist, helped initially but sx returned. Associated symptoms: no abdominal pain, no cough, no fever and no shortness of breath   Risk factors: no aortic disease, no coronary artery disease, no diabetes mellitus, no high cholesterol, no hypertension and no prior DVT/PE        Home Medications Prior to Admission medications   Medication Sig Start Date End Date Taking? Authorizing Provider  naproxen (NAPROSYN) 375 MG tablet Take 1 tablet (375 mg total) by mouth 2 (two) times daily. 01/20/22  Yes Dorie Rank, MD  BIOTIN PO Take by mouth.    [provider]  Carboxymethylcellul-Glycerin (REFRESH OPTIVE OP) 1 drop 3 (three) times daily as needed.    [provider]  cholecalciferol (VITAMIN D) 1000 units tablet Take 1,000 Units by mouth daily.    [provider]  omeprazole (PRILOSEC OTC) 20 MG tablet Take 20 mg by mouth daily.    [provider]  SYNTHROID 50 MCG tablet  10/19/16   [provider]  SYNTHROID 75 MCG tablet Take 75 mcg by mouth daily. 01/17/22   [provider]      Allergies    Codeine and Celexa [citalopram]    Review of Systems   Review of Systems  Constitutional:  Negative for fever.  Respiratory:  Negative for cough and shortness of breath.   Cardiovascular:  Positive for chest pain.  Gastrointestinal:   Negative for abdominal pain.    Physical Exam Updated Vital Signs BP 121/84   Pulse 73   Temp 98.3 F (36.8 C) (Oral)   Resp 18   Ht 1.575 m (5\' 2" )   Wt 69.4 kg   SpO2 97%   BMI 27.98 kg/m  Physical Exam Vitals and nursing note reviewed.  Constitutional:      General: She is not in acute distress.    Appearance: She is well-developed.  HENT:     Head: Normocephalic and atraumatic.     Right Ear: External ear normal.     Left Ear: External ear normal.  Eyes:     General: No scleral icterus.       Right eye: No discharge.        Left eye: No discharge.     Conjunctiva/sclera: Conjunctivae normal.  Neck:     Trachea: No tracheal deviation.  Cardiovascular:     Rate and Rhythm: Normal rate and regular rhythm.  Pulmonary:     Effort: Pulmonary effort is normal. No respiratory distress.     Breath sounds: Normal breath sounds. No stridor. No wheezing or rales.  Abdominal:     General: Bowel sounds are normal. There is no distension.     Palpations: Abdomen is soft.     Tenderness: There is no abdominal tenderness. There is no guarding or rebound.  Musculoskeletal:        General:  No tenderness or deformity.     Cervical back: Neck supple.  Skin:    General: Skin is warm and dry.     Findings: No rash.  Neurological:     General: No focal deficit present.     Mental Status: She is alert.     Cranial Nerves: No cranial nerve deficit (no facial droop, extraocular movements intact, no slurred speech).     Sensory: No sensory deficit.     Motor: No abnormal muscle tone or seizure activity.     Coordination: Coordination normal.  Psychiatric:        Mood and Affect: Mood normal.     ED Results / Procedures / Treatments   Labs (all labs ordered are listed, but only abnormal results are displayed) Labs Reviewed  BASIC METABOLIC PANEL - Abnormal; Notable for the following components:      Result Value   Calcium 10.4 (*)    All other components within normal limits   CBC  D-DIMER, QUANTITATIVE  CBG MONITORING, ED  TROPONIN I (HIGH SENSITIVITY)  TROPONIN I (HIGH SENSITIVITY)    EKG EKG Interpretation  Date/Time:  Thursday January 20 2022 10:12:58 EDT Ventricular Rate:  77 PR Interval:  185 QRS Duration: 80 QT Interval:  361 QTC Calculation: 409 R Axis:   76 Text Interpretation: Sinus rhythm No significant change since last tracing Confirmed by Linwood Dibbles [phone]) on 01/20/2022 10:18:32 AM  Radiology DG Chest Portable 1 View  Result Date: 01/20/2022 CLINICAL DATA:  Sharp chest pain since last night EXAM: PORTABLE CHEST 1 VIEW COMPARISON:  04/20/2017 FINDINGS: Cardiac and mediastinal contours are within normal limits. No focal pulmonary opacity. No pleural effusion or pneumothorax. No acute osseous abnormality. IMPRESSION: No acute cardiopulmonary process. Electronically Signed   By: Wiliam Ke M.D.   On: 01/20/2022 11:01    Procedures Procedures    Medications Ordered in ED Medications  aspirin chewable tablet 324 mg (324 mg Oral Given 01/20/22 1044)    ED Course/ Medical Decision Making/ A&P Clinical Course as of 01/20/22 1308  Thu Jan 20, 2022  1105 CXR negative for acute process [JK]  1131 D-dimer, quantitative [JK]  1131 D-dimer normal [JK]  1304 Troponin I (High Sensitivity) Serial troponins normal [JK]    Clinical Course User Index [JK] Linwood Dibbles, MD           HEART Score: 1                Medical Decision Making Differential diagnosis includes but not limited to acute coronary syndrome, pericarditis, pulmonary embolism, musculoskeletal etiology  Amount and/or Complexity of Data Reviewed Labs: ordered. Decision-making details documented in ED Course. Radiology: ordered.  Risk OTC drugs.   Patient presented to the ED with intermittent sharp chest pain.  ED work-up reassuring.  No signs of pneumonia or pneumothorax on x-ray.  D-dimer negative.  Low risk for PE.  Doubt pulmonary embolism.  Patient low risk heart  score with normal serial troponins and reassuring EKG.  Doubt acute coronary syndrome.  ED work-up overall reassuring.  Unclear etiology of her symptoms but no signs of any emergency medical condition.  Appears stable for discharge.  Will refer to cardiology for outpatient eval        Final Clinical Impression(s) / ED Diagnoses Final diagnoses:  Chest pain, unspecified type    Rx / DC Orders ED Discharge Orders          Ordered    Ambulatory referral to Cardiology  Comments: If you have not heard from the Cardiology office within the next 72 hours please call [phone].   01/20/22 1308    naproxen (NAPROSYN) 375 MG tablet  2 times daily        01/20/22 1308              Linwood Dibbles, MD 01/20/22 1308

## 2022-01-20 NOTE — Discharge Instructions (Signed)
Take the medications as prescribed to help with pain and discomfort.  Follow-up with a cardiologist for further evaluation.  The office should call you to schedule an appointment.

## 2022-02-02 ENCOUNTER — Encounter: Payer: Self-pay | Admitting: Podiatry

## 2022-02-10 DIAGNOSIS — R079 Chest pain, unspecified: Secondary | ICD-10-CM | POA: Insufficient documentation

## 2022-02-10 NOTE — Progress Notes (Signed)
Cardiology Office Note   Date:  02/11/2022   ID:  Debbie Owens, DOB 1955-12-29, MRN [id number]  PCP:  Lujean Amel, MD  Cardiologist:   None Referring:  Lujean Amel, MD  No chief complaint on file.     History of Present Illness: Debbie Owens is a 66 y.o. female who presents for evaluation of chest pain.   She is referred by Lujean Amel, MD.  The patient was in the ED on 9/21 with chest pain.  I reviewed these records for this visit.    There was no objective evidence of ischemia.  D-dimer was normal.  She had no past cardiac history other than being told that she had a murmur and mitral valve prolapse in the past.  Echo in 2021 demonstrated a normal EF with no wall motion abnormalities or valve abnormalities.    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  Has been noticing some chest discomfort.  This was off and on.  The evening before presentation it was more persistent.  It would come and go.  It would be sharp and shooting and then linger with some discomfort on the left arm.  This was throughout the night.  It was 5 out of 10 in intensity.  She describes it as a "ache jolt."  There was no associated nausea vomiting diaphoresis.  She is not short of breath.  She has not had PND or orthopnea.  She does not really describe palpitations and has not had any presyncope or syncope.  She is active in her job and cannot bring the symptoms on.  They seem to occur at rest.   Past Medical History:  Diagnosis Date   Eczema    Graves disease    Urticaria     Past Surgical History:  Procedure Laterality Date   TONSILLECTOMY     VAGINAL DELIVERY     3x     Current Outpatient Medications  Medication Sig Dispense Refill   Calcium Carb-Cholecalciferol (CALCIUM 1000 + D PO) Take by mouth.     cetirizine (ZYRTEC) 10 MG tablet Take by mouth as needed.     cholecalciferol (VITAMIN D) 1000 units tablet Take 1,000 Units by mouth daily.     fluocinonide (LIDEX) 0.05 % external  solution as needed.     fluticasone (FLONASE ALLERGY RELIEF) 50 MCG/ACT nasal spray Spray 1 spray every day by intranasal route.     ketoconazole (NIZORAL) 2 % shampoo as needed.     meloxicam (MOBIC) 15 MG tablet as needed.     methocarbamol (ROBAXIN) 500 MG tablet Take 500 mg by mouth as needed.     naproxen (NAPROSYN) 375 MG tablet Take 1 tablet (375 mg total) by mouth 2 (two) times daily. 20 tablet 0   omeprazole (PRILOSEC OTC) 20 MG tablet Take 20 mg by mouth daily.     SYNTHROID 75 MCG tablet Take 75 mcg by mouth daily.     No current facility-administered medications for this visit.    Allergies:   Codeine and Celexa [citalopram]    Social History:  The patient  reports that she has never smoked. She has been exposed to tobacco smoke. She has never used smokeless tobacco. She reports that she does not drink alcohol and does not use drugs.   Family History:  The patient's family history includes Atrial fibrillation in her mother; Bladder Cancer in her brother; COPD in her father and mother; Diabetes in her brother and mother;  Hyperlipidemia in her father; Hypertension in her brother; Liver cancer in her mother; Thyroid disease in her mother.    ROS:  Please see the history of present illness.   Otherwise, review of systems are positive for none.   All other systems are reviewed and negative.    PHYSICAL EXAM: VS:  BP 108/64   Pulse 77   Ht 5\' 3"  (1.6 m)   Wt 154 lb 6.4 oz (70 kg)   SpO2 99%   BMI 27.35 kg/m  , BMI Body mass index is 27.35 kg/m. GENERAL:  Well appearing HEENT:  Pupils equal round and reactive, fundi not visualized, oral mucosa unremarkable NECK:  No jugular venous distention, waveform within normal limits, carotid upstroke brisk and symmetric, no bruits, no thyromegaly LYMPHATICS:  No cervical, inguinal adenopathy LUNGS:  Clear to auscultation bilaterally BACK:  No CVA tenderness CHEST:  Unremarkable HEART:  PMI not displaced or sustained,S1 and S2 within  normal limits, no S3, no S4, no clicks, no rubs, no murmurs ABD:  Flat, positive bowel sounds normal in frequency in pitch, no bruits, no rebound, no guarding, no midline pulsatile mass, no hepatomegaly, no splenomegaly EXT:  2 plus pulses throughout, no edema, no cyanosis no clubbing SKIN:  No rashes no nodules NEURO:  Cranial nerves II through XII grossly intact, motor grossly intact throughout PSYCH:  Cognitively intact, oriented to person place and time    EKG:  EKG is not ordered today. The ekg ordered today demonstrates sinus rhythm, rate 77, axis within normal limits, intervals within normal limits, no acute ST-T wave changes.   Recent Labs: 01/20/2022: BUN 17; Creatinine, Ser 0.73; Hemoglobin 14.1; Platelets 221; Potassium 4.2; Sodium 141    Lipid Panel No results found for: "CHOL", "TRIG", "HDL", "CHOLHDL", "VLDL", "LDLCALC", "LDLDIRECT"    Wt Readings from Last 3 Encounters:  02/11/22 154 lb 6.4 oz (70 kg)  01/20/22 153 lb (69.4 kg)  01/23/17 157 lb (71.2 kg)      Other studies Reviewed: Additional studies/ records that were reviewed today include: ED records. Review of the above records demonstrates:  Please see elsewhere in the note.     ASSESSMENT AND PLAN:  Chest pain: The pretest probability of obstructive coronary disease I think is low.  I think the pain is predominantly nonanginal.  My plan is to screen her with a POET (Plain Old Exercise Treadmill).  I would like to also order a coronary calcium score for risk stratification.  Dyslipidemia: Her LDL was only 1 2.  HDL was 63.  Goals of therapy will be based on the above testing.  We did talk about therapeutic lifestyle changes to good exercise diet.      Current medicines are reviewed at length with the patient today.  The patient has concerns regarding medicines.  The following changes have been made:  no change  Labs/ tests ordered today include:   Orders Placed This Encounter  Procedures   CT  CARDIAC SCORING (SELF PAY ONLY)   Exercise Tolerance Test     Disposition:   FU with me as needed.      Signed, 01/25/17, MD  02/11/2022 10:51 AM    [facility]

## 2022-02-11 ENCOUNTER — Ambulatory Visit: Payer: BLUE CROSS/BLUE SHIELD | Attending: Cardiology | Admitting: Cardiology

## 2022-02-11 ENCOUNTER — Encounter: Payer: Self-pay | Admitting: Cardiology

## 2022-02-11 VITALS — BP 108/64 | HR 77 | Ht 63.0 in | Wt 154.4 lb

## 2022-02-11 DIAGNOSIS — R079 Chest pain, unspecified: Secondary | ICD-10-CM | POA: Diagnosis not present

## 2022-02-11 NOTE — Patient Instructions (Signed)
  Testing/Procedures:  Your physician has requested that you have an exercise tolerance test. For further information please visit [email or web address]. Please also follow instruction sheet, as given. [facility]   CORONARY CALCIUM SCORING CT AT THE [facility]   Follow-Up: At [facility], you and your health needs are our priority.  As part of our continuing mission to provide you with exceptional heart care, we have created designated Provider Care Teams.  These Care Teams include your primary Cardiologist (physician) and Advanced Practice Providers (APPs -  Physician Assistants and Nurse Practitioners) who all work together to provide you with the care you need, when you need it.  We recommend signing up for the patient portal called "MyChart".  Sign up information is provided on this After Visit Summary.  MyChart is used to connect with patients for Virtual Visits (Telemedicine).  Patients are able to view lab/test results, encounter notes, upcoming appointments, etc.  Non-urgent messages can be sent to your provider as well.   To learn more about what you can do with MyChart, go to [email or web address].    Your next appointment:    AS NEEDED

## 2022-02-17 ENCOUNTER — Ambulatory Visit: Payer: No Typology Code available for payment source | Admitting: Podiatry

## 2022-02-18 ENCOUNTER — Telehealth: Payer: Self-pay | Admitting: Podiatry

## 2022-02-18 ENCOUNTER — Ambulatory Visit: Payer: No Typology Code available for payment source | Admitting: Podiatry

## 2022-02-18 NOTE — Telephone Encounter (Signed)
Patient injured her knee and cant come in for her appointment she would like someone to call her back and give her the results from her nail clippings,

## 2022-02-23 NOTE — Telephone Encounter (Signed)
Return call to patient for lab results, no answer, left voice message for call back.

## 2022-02-24 ENCOUNTER — Telehealth (HOSPITAL_COMMUNITY): Payer: Self-pay | Admitting: *Deleted

## 2022-02-24 NOTE — Telephone Encounter (Signed)
Close encounter 

## 2022-02-25 ENCOUNTER — Ambulatory Visit (HOSPITAL_COMMUNITY)
Admission: RE | Admit: 2022-02-25 | Discharge: 2022-02-25 | Disposition: A | Discharge status: Home / Self Care | Payer: BLUE CROSS/BLUE SHIELD | Source: Ambulatory Visit | Attending: Cardiology | Admitting: Cardiology

## 2022-02-25 DIAGNOSIS — R079 Chest pain, unspecified: Secondary | ICD-10-CM | POA: Diagnosis present

## 2022-02-25 LAB — EXERCISE TOLERANCE TEST
Estimated workload: 10.3
Exercise duration (min): 8 min
Exercise duration (sec): 37 s
MPHR: 156 {beats}/min
Peak HR: 164 {beats}/min
Percent HR: 156 %
Rest HR: 70 {beats}/min
ST Elevation (mm): 1 mm

## 2022-02-28 ENCOUNTER — Encounter: Payer: Self-pay | Admitting: *Deleted

## 2022-02-28 ENCOUNTER — Telehealth: Payer: Self-pay | Admitting: Cardiology

## 2022-02-28 NOTE — Telephone Encounter (Signed)
Patient is calling for results to her stress test.

## 2022-02-28 NOTE — Telephone Encounter (Signed)
Returned call to patient and discussed results of ETT.  Per Dr. Percival Spanish: Negative for evidence of ischemia.  Call Debbie Owens with the results and send results to [facility], [place], MD. No further work up.    All questions were answered, patient verbalized understanding and expressed appreciation for call.

## 2022-03-03 NOTE — Telephone Encounter (Signed)
Noted, thanks!

## 2022-03-28 ENCOUNTER — Other Ambulatory Visit (HOSPITAL_BASED_OUTPATIENT_CLINIC_OR_DEPARTMENT_OTHER): Payer: No Typology Code available for payment source

## 2022-04-14 ENCOUNTER — Ambulatory Visit: Payer: No Typology Code available for payment source | Admitting: Podiatry

## 2022-05-13 ENCOUNTER — Ambulatory Visit (HOSPITAL_BASED_OUTPATIENT_CLINIC_OR_DEPARTMENT_OTHER)
Admission: RE | Admit: 2022-05-13 | Discharge: 2022-05-13 | Disposition: A | Discharge status: Home / Self Care | Payer: No Typology Code available for payment source | Source: Ambulatory Visit | Attending: Cardiology | Admitting: Cardiology

## 2022-05-13 DIAGNOSIS — R079 Chest pain, unspecified: Secondary | ICD-10-CM | POA: Insufficient documentation

## 2022-05-17 ENCOUNTER — Telehealth: Payer: Self-pay | Admitting: Cardiology

## 2022-05-17 NOTE — Telephone Encounter (Signed)
Patient wanted to know results of cardiac scoring done on 1/12. Explained that as soon as Dr. Percival Spanish reviews it, we will contact her.

## 2022-05-17 NOTE — Telephone Encounter (Signed)
Gave patient results of Ca scoring per Dr. Percival Spanish. Discussed with patient CAD risk reduction measures. Will mail handouts to her regarding diet, exercise, and stress reduction.

## 2022-05-17 NOTE — Telephone Encounter (Signed)
Good afternoon! Patient called and mentioned that she would like for someone to go over her results with her.

## 2022-05-20 ENCOUNTER — Encounter: Payer: Self-pay | Admitting: *Deleted

## 2022-08-17 ENCOUNTER — Emergency Department (HOSPITAL_BASED_OUTPATIENT_CLINIC_OR_DEPARTMENT_OTHER): Payer: BLUE CROSS/BLUE SHIELD

## 2022-08-17 ENCOUNTER — Other Ambulatory Visit: Payer: Self-pay

## 2022-08-17 ENCOUNTER — Emergency Department (HOSPITAL_BASED_OUTPATIENT_CLINIC_OR_DEPARTMENT_OTHER)
Admission: EM | Admit: 2022-08-17 | Discharge: 2022-08-17 | Disposition: A | Discharge status: Home / Self Care | Payer: BLUE CROSS/BLUE SHIELD | Attending: Emergency Medicine | Admitting: Emergency Medicine

## 2022-08-17 ENCOUNTER — Encounter (HOSPITAL_BASED_OUTPATIENT_CLINIC_OR_DEPARTMENT_OTHER): Payer: Self-pay

## 2022-08-17 DIAGNOSIS — R6883 Chills (without fever): Secondary | ICD-10-CM | POA: Diagnosis not present

## 2022-08-17 DIAGNOSIS — Z1152 Encounter for screening for COVID-19: Secondary | ICD-10-CM | POA: Insufficient documentation

## 2022-08-17 DIAGNOSIS — R300 Dysuria: Secondary | ICD-10-CM | POA: Insufficient documentation

## 2022-08-17 DIAGNOSIS — R000 Tachycardia, unspecified: Secondary | ICD-10-CM | POA: Diagnosis not present

## 2022-08-17 DIAGNOSIS — R350 Frequency of micturition: Secondary | ICD-10-CM | POA: Diagnosis present

## 2022-08-17 DIAGNOSIS — M545 Low back pain, unspecified: Secondary | ICD-10-CM | POA: Insufficient documentation

## 2022-08-17 LAB — RESP PANEL BY RT-PCR (RSV, FLU A&B, COVID)  RVPGX2
Influenza A by PCR: NEGATIVE
Influenza B by PCR: NEGATIVE
Resp Syncytial Virus by PCR: NEGATIVE
SARS Coronavirus 2 by RT PCR: NEGATIVE

## 2022-08-17 LAB — URINALYSIS, MICROSCOPIC (REFLEX)

## 2022-08-17 LAB — URINALYSIS, ROUTINE W REFLEX MICROSCOPIC
Bilirubin Urine: NEGATIVE
Glucose, UA: NEGATIVE mg/dL
Ketones, ur: NEGATIVE mg/dL
Leukocytes,Ua: NEGATIVE
Nitrite: NEGATIVE
Protein, ur: NEGATIVE mg/dL
Specific Gravity, Urine: 1.015 (ref 1.005–1.030)
pH: 7.5 (ref 5.0–8.0)

## 2022-08-17 LAB — CBC
HCT: 43.4 % (ref 36.0–46.0)
Hemoglobin: 14.6 g/dL (ref 12.0–15.0)
MCH: 30.4 pg (ref 26.0–34.0)
MCHC: 33.6 g/dL (ref 30.0–36.0)
MCV: 90.4 fL (ref 80.0–100.0)
Platelets: 225 10*3/uL (ref 150–400)
RBC: 4.8 MIL/uL (ref 3.87–5.11)
RDW: 13.2 % (ref 11.5–15.5)
WBC: 17.5 10*3/uL — ABNORMAL HIGH (ref 4.0–10.5)
nRBC: 0 % (ref 0.0–0.2)

## 2022-08-17 LAB — COMPREHENSIVE METABOLIC PANEL
ALT: 14 U/L (ref 0–44)
AST: 15 U/L (ref 15–41)
Albumin: 4.5 g/dL (ref 3.5–5.0)
Alkaline Phosphatase: 68 U/L (ref 38–126)
Anion gap: 9 (ref 5–15)
BUN: 12 mg/dL (ref 8–23)
CO2: 25 mmol/L (ref 22–32)
Calcium: 10.1 mg/dL (ref 8.9–10.3)
Chloride: 103 mmol/L (ref 98–111)
Creatinine, Ser: 0.74 mg/dL (ref 0.44–1.00)
GFR, Estimated: >60 mL/min (ref >60)
Glucose, Bld: 162 mg/dL — ABNORMAL HIGH (ref 70–99)
Potassium: 3.9 mmol/L (ref 3.5–5.1)
Sodium: 137 mmol/L (ref 135–145)
Total Bilirubin: 0.7 mg/dL (ref 0.3–1.2)
Total Protein: 7.9 g/dL (ref 6.5–8.1)

## 2022-08-17 LAB — LIPASE, BLOOD: Lipase: 14 U/L (ref 11–51)

## 2022-08-17 LAB — LACTIC ACID, PLASMA: Lactic Acid, Venous: 1.2 mmol/L (ref 0.5–1.9)

## 2022-08-17 MED ORDER — MORPHINE SULFATE (PF) 4 MG/ML IV SOLN
4.0000 mg | Freq: Once | INTRAVENOUS | Status: AC
  Administered 2022-08-17: 4 mg via INTRAVENOUS
  Filled 2022-08-17: qty 1

## 2022-08-17 MED ORDER — LACTATED RINGERS IV SOLN: INTRAVENOUS | Status: DC

## 2022-08-17 MED ORDER — SODIUM CHLORIDE 0.9 % IV SOLN
2.0000 g | INTRAVENOUS | Status: DC
  Administered 2022-08-17: 2 g via INTRAVENOUS
  Filled 2022-08-17: qty 20

## 2022-08-17 MED ORDER — HYDROCODONE-ACETAMINOPHEN 5-325 MG PO TABS: 1.0000 | ORAL_TABLET | ORAL | 0 refills | Status: AC | PRN

## 2022-08-17 MED ORDER — IOHEXOL 300 MG/ML  SOLN
100.0000 mL | Freq: Once | INTRAMUSCULAR | Status: AC | PRN
  Administered 2022-08-17: 75 mL via INTRAVENOUS

## 2022-08-17 MED ORDER — IBUPROFEN 600 MG PO TABS: 600.0000 mg | ORAL_TABLET | Freq: Four times a day (QID) | ORAL | 0 refills | Status: AC | PRN

## 2022-08-17 MED ORDER — LACTATED RINGERS IV BOLUS (SEPSIS)
1000.0000 mL | Freq: Once | INTRAVENOUS | Status: AC
  Administered 2022-08-17: 1000 mL via INTRAVENOUS

## 2022-08-17 MED ORDER — ACETAMINOPHEN 500 MG PO TABS
1000.0000 mg | ORAL_TABLET | Freq: Once | ORAL | Status: AC
  Administered 2022-08-17: 1000 mg via ORAL
  Filled 2022-08-17: qty 2

## 2022-08-17 MED ORDER — KETOROLAC TROMETHAMINE 30 MG/ML IJ SOLN
30.0000 mg | Freq: Once | INTRAMUSCULAR | Status: AC
  Administered 2022-08-17: 30 mg via INTRAVENOUS
  Filled 2022-08-17: qty 1

## 2022-08-17 MED ORDER — CEPHALEXIN 500 MG PO CAPS: 500.0000 mg | ORAL_CAPSULE | Freq: Three times a day (TID) | ORAL | 0 refills | Status: AC

## 2022-08-17 MED ORDER — LACTATED RINGERS IV BOLUS (SEPSIS): 250.0000 mL | Freq: Once | INTRAVENOUS | Status: DC

## 2022-08-17 NOTE — ED Notes (Signed)
Patient resting quietly in stretcher with wet cloth over eyes, respirations even, unlabored, no acute distress noted. Denies needs at this time.

## 2022-08-17 NOTE — ED Notes (Signed)
Attempted to clarify fluid orders for patinet, MD Particia Nearing states discontinue all fluids as patient will be discharged shortly.

## 2022-08-17 NOTE — ED Triage Notes (Signed)
Patient here POV from Home.  Endorses Frequency that began 5-6 Days ago along with some Lower ABD Discomfort. Seen by PCP Office yesterday and diagnosed with UTI. Given Antibiotic Course but seeks Evaluation today for Dizziness, Tachycardia, ABD Pain, and general uneasiness/discomfort.   NAD noted during Triage. A&Ox4. GCS 15. BIB Wheelchair.

## 2022-08-17 NOTE — ED Notes (Signed)
Reviewed AVS with patient, patient expressed understanding of directions, denies further questions at this time. 

## 2022-08-17 NOTE — Sepsis Progress Note (Signed)
Code Sepsis protocol being monitored by eLink. 

## 2022-08-17 NOTE — ED Notes (Signed)
Spoke with lab to run Blood cultures

## 2022-08-17 NOTE — ED Provider Notes (Signed)
Pt signed out by Dr. Donnald Garre pending CT chest.  CT chest:   No acute intrathoracic pathology.  2. Probable small focus of scarring in the lingula.   Covid/flu/rsv neg  Pt is feeling better after fluids.  She still has a headache and back pain.  I told her we can do a LP as she may have meningitis.  However, she declines.   Although urine looks ok now, it may be partially treated, so I am going to change her abx from Bactrim to keflex.  Pt did get rocephin in the ED.  Pt is stable for d/c.  She knows to return if she is worsening.     Jacalyn Lefevre, MD 08/17/22 [phone]

## 2022-08-17 NOTE — Sepsis Progress Note (Signed)
Notified bedside nurse of need to draw and administer lactic acid, blood cultures, and fluid bolus and antibiotic.

## 2022-08-17 NOTE — ED Provider Notes (Signed)
[place] EMERGENCY DEPARTMENT AT [facility] Provider Note   CSN: [id number] Arrival date & time: 08/17/22  1226     History  Chief Complaint  Patient presents with   Urinary Frequency    Debbie Owens is a 67 y.o. female.  HPI Patient was about 5 days ago she started getting urinary frequency and lower abdominal discomfort.  She was trying cranberry juice and Pyridium at home.  Symptoms advanced and she saw her PCP in the office yesterday.  She reports that she was diagnosed with a UTI based on the urine dip specimen and given Bactrim to start.  She reports today she felt much worse.  She reports she has had chills and bodyaches.  She reports she has felt dizzy when she stands up and nauseated.  She reports she also now has pain across all of her lower back.  She reports she had a urinary tract infection once earlier in the year but otherwise is healthy at baseline.  She has not had recurrent infections or kidney stones.    Home Medications Prior to Admission medications   Medication Sig Start Date End Date Taking? Authorizing Provider  Calcium Carb-Cholecalciferol (CALCIUM 1000 + D PO) Take by mouth.    [provider]  cetirizine (ZYRTEC) 10 MG tablet Take by mouth as needed.    [provider]  cholecalciferol (VITAMIN D) 1000 units tablet Take 1,000 Units by mouth daily.    [provider]  fluocinonide (LIDEX) 0.05 % external solution as needed.    [provider]  fluticasone (FLONASE ALLERGY RELIEF) 50 MCG/ACT nasal spray Spray 1 spray every day by intranasal route.    [provider]  ketoconazole (NIZORAL) 2 % shampoo as needed.    [provider]  meloxicam (MOBIC) 15 MG tablet as needed.    [provider]  methocarbamol (ROBAXIN) 500 MG tablet Take 500 mg by mouth as needed. 11/12/21   [provider]  naproxen (NAPROSYN) 375 MG tablet Take 1 tablet (375 mg total) by mouth 2 (two) times  daily. 01/20/22   Linwood Dibbles, MD  omeprazole (PRILOSEC OTC) 20 MG tablet Take 20 mg by mouth daily.    [provider]  SYNTHROID 75 MCG tablet Take 75 mcg by mouth daily. 01/17/22   [provider]      Allergies    Codeine and Celexa [citalopram]    Review of Systems   Review of Systems  Physical Exam Updated Vital Signs BP (!) 142/79 (BP Location: Right Arm)   Pulse (!) 118   Temp 98.9 F (37.2 C) (Oral)   Resp 18   Ht  (1.575 m)   Wt 67.6 kg   SpO2 97%   BMI 27.25 kg/m  Physical Exam Constitutional:      Comments: Patient is mildly ill in appearance.  Uncomfortable.  Well-nourished well-developed.  No respiratory distress.  HENT:     Head: Normocephalic and atraumatic.     Mouth/Throat:     Pharynx: Oropharynx is clear.  Eyes:     Extraocular Movements: Extraocular movements intact.  Cardiovascular:     Comments: Tachycardia.  No rub murmur gallop. Pulmonary:     Effort: Pulmonary effort is normal.     Breath sounds: Normal breath sounds.  Abdominal:     Comments: Abdomen soft.  Mild suprapubic discomfort to palpation.  No severe CVA pain to percussion.  Musculoskeletal:        General: Normal range of  motion.     Right lower leg: No edema.     Left lower leg: No edema.  Skin:    General: Skin is warm and dry.  Neurological:     General: No focal deficit present.     Mental Status: She is oriented to person, place, and time.     Motor: No weakness.     Coordination: Coordination normal.  Psychiatric:        Mood and Affect: Mood normal.     ED Results / Procedures / Treatments   Labs (all labs ordered are listed, but only abnormal results are displayed) Labs Reviewed  COMPREHENSIVE METABOLIC PANEL - Abnormal; Notable for the following components:      Result Value   Glucose, Bld 162 (*)    All other components within normal limits  CBC - Abnormal; Notable for the following components:   WBC 17.5 (*)    All other components  within normal limits  URINALYSIS, ROUTINE W REFLEX MICROSCOPIC - Abnormal; Notable for the following components:   Hgb urine dipstick LARGE (*)    All other components within normal limits  URINALYSIS, MICROSCOPIC (REFLEX) - Abnormal; Notable for the following components:   Bacteria, UA RARE (*)    All other components within normal limits  CULTURE, BLOOD (ROUTINE X 2)  CULTURE, BLOOD (ROUTINE X 2)  LIPASE, BLOOD  LACTIC ACID, PLASMA  LACTIC ACID, PLASMA    EKG EKG Interpretation  Date/Time:  Wednesday August 17 2022 12:43:19 EDT Ventricular Rate:  112 PR Interval:  195 QRS Duration: 80 QT Interval:  309 QTC Calculation: 422 R Axis:   66 Text Interpretation: Sinus tachycardia tachycardia, otherwise no sig change from previous Confirmed by Arby Barrette [phone]) on 08/17/2022 2:34:38 PM  Radiology DG Chest 2 View  Result Date: 08/17/2022 CLINICAL DATA:  Sepsis EXAM: CHEST - 2 VIEW COMPARISON:  X-ray 01/20/2022 FINDINGS: Hyperinflation with some chronic lung changes. Subtle opacity left lung base towards the lingula. A subtle infiltrate is not excluded. Recommend follow-up. No pneumothorax, effusion or edema. Normal cardiopericardial silhouette. Overlapping cardiac leads. IMPRESSION: Hyperinflation with chronic changes. Subtle opacity along the lingula is new from previous. A subtle infiltrate is possible. Recommend follow-up Electronically Signed   By: Karen Kays M.D.   On: 08/17/2022 14:25   CT Renal Stone Study  Result Date: 08/17/2022 CLINICAL DATA:  Urinary frequency for 5-6 days.  Recent UTI EXAM: CT ABDOMEN AND PELVIS WITHOUT CONTRAST TECHNIQUE: Multidetector CT imaging of the abdomen and pelvis was performed following the standard protocol without IV contrast. RADIATION DOSE REDUCTION: This exam was performed according to the departmental dose-optimization program which includes automated exposure control, adjustment of the mA and/or kV according to patient size and/or use of  iterative reconstruction technique. COMPARISON:  CT 07/10/2017 FINDINGS: Lower chest: Lung bases are clear.  No pleural effusion. Hepatobiliary: No focal liver abnormality is seen. No gallstones, gallbladder wall thickening, or biliary dilatation. Pancreas: Unremarkable. No pancreatic ductal dilatation or surrounding inflammatory changes. Spleen: Normal in size without focal abnormality.  Small splenule. Adrenals/Urinary Tract: The adrenal glands are preserved. No abnormal calcifications are seen within either kidney nor along the course of either ureter. Left-sided renal cysts identified measuring 3.7 cm in diameter with Hounsfield unit of 4. Lesion is not significant changed from previous examination. Bosniak 1 lesion. No specific follow-up. Additional parapelvic left-sided renal cyst. Preserved contours of the urinary bladder. Stomach/Bowel: Diffuse colonic stool. Left-sided colonic diverticula. No obstruction or dilatation on this non  oral contrast exam. Normal retrocecal appendix. Stomach has some mild luminal fluid. Small bowel is nondilated. Vascular/Lymphatic: Mild vascular calcifications. Normal caliber aorta and IVC. No specific abnormal lymph node enlargement seen in the abdomen and pelvis. Reproductive: Uterus and bilateral adnexa are unremarkable. Other: Small fat containing umbilical hernia. No free air or free fluid. Musculoskeletal: Mild degenerative changes of the spine and pelvis. IMPRESSION: No obstructing renal stone. Few colonic diverticula.  Scattered stool.  Normal appendix Electronically Signed   By: Karen Kays M.D.   On: 08/17/2022 14:24    Procedures Procedures   CRITICAL CARE Performed by: Arby Barrette   Total critical care time: 30 minutes  Critical care time was exclusive of separately billable procedures and treating other patients.  Critical care was necessary to treat or prevent imminent or life-threatening deterioration.  Critical care was time spent personally by  me on the following activities: development of treatment plan with patient and/or surrogate as well as nursing, discussions with consultants, evaluation of patient's response to treatment, examination of patient, obtaining history from patient or surrogate, ordering and performing treatments and interventions, ordering and review of laboratory studies, ordering and review of radiographic studies, pulse oximetry and re-evaluation of patient's condition.  Medications Ordered in ED Medications  lactated ringers infusion (has no administration in time range)  lactated ringers bolus 1,000 mL (has no administration in time range)    And  lactated ringers bolus 1,000 mL (1,000 mLs Intravenous New Bag/Given 08/17/22 1456)    And  lactated ringers bolus 250 mL (has no administration in time range)  cefTRIAXone (ROCEPHIN) 2 g in sodium chloride 0.9 % 100 mL IVPB (2 g Intravenous New Bag/Given 08/17/22 1502)  ketorolac (TORADOL) 30 MG/ML injection 30 mg (has no administration in time range)    ED Course/ Medical Decision Making/ A&P                             Medical Decision Making Amount and/or Complexity of Data Reviewed Labs: ordered. Radiology: ordered.  Risk Prescription drug management.   Patient presents with symptoms of UTI.  She been seen by PCP and had a positive dip specimen done in the office.  She was started on Bactrim yesterday.  Symptoms have progressed to tachycardia weakness and low back pain.  Differential diagnosis includes pyelonephritis\diverticulitis\UTI\sepsis.   Patient leukocytosis 17,000.  Basic chemistry panel otherwise normal.  Source of infection previously UTI and symptoms, will initiate Rocephin and fluid resuscitation.  Urinalysis pending.  Urinalysis negative.  Will proceed with CT chest to further evaluate for pneumonia as possible source of sepsis presentation.  Dr. Particia Nearing to review CT results.  At this time patient will need reassessment after  completion of fluid resuscitation for response to treatment and decision making regarding admission versus discharge.        Final Clinical Impression(s) / ED Diagnoses Final diagnoses:  Acute bilateral low back pain without sciatica  Dysuria  Chills  Tachycardia    Rx / DC Orders ED Discharge Orders     None         Arby Barrette, MD 08/17/22 1552

## 2022-08-17 NOTE — Discharge Instructions (Addendum)
Stop Bactrim.  If you are not any better, you need to return to the ED.

## 2022-08-18 LAB — CULTURE, BLOOD (ROUTINE X 2)
Culture: NO GROWTH
Special Requests: ADEQUATE

## 2022-08-20 LAB — CULTURE, BLOOD (ROUTINE X 2): Culture: NO GROWTH

## 2022-08-21 LAB — CULTURE, BLOOD (ROUTINE X 2): Special Requests: ADEQUATE

## 2022-08-22 LAB — CULTURE, BLOOD (ROUTINE X 2)

## 2024-01-31 ENCOUNTER — Ambulatory Visit: Admitting: Podiatry

## 2024-01-31 ENCOUNTER — Encounter: Payer: Self-pay | Admitting: Podiatry

## 2024-01-31 DIAGNOSIS — B351 Tinea unguium: Secondary | ICD-10-CM | POA: Diagnosis not present

## 2024-01-31 MED ORDER — TERBINAFINE HCL 250 MG PO TABS: 250.0000 mg | ORAL_TABLET | Freq: Every day | ORAL | 0 refills | Status: AC

## 2024-01-31 NOTE — Progress Notes (Signed)
  Subjective:  Patient ID: Debbie Owens, female    DOB: 1955/06/13,   MRN: [id number]  Chief Complaint  Patient presents with   Nail Problem    I want her to look at this toenail.  It's getting thicker and thicker.  It hurts, the whole top of the toe. ( Hallux right)    68 y.o. female presents for follow-up about nail changes she was seen two years ago for nail fungus and culture taken then and never followed up and was unable to reach to discuss results.  . Denies any other pedal complaints. Denies n/v/f/c.   Past Medical History:  Diagnosis Date   Eczema    Graves disease    Urticaria     Objective:  Physical Exam: Vascular: DP/PT pulses 2/4 bilateral. CFT <3 seconds. Normal hair growth on digits. No edema.  Skin. No lacerations or abrasions bilateral feet. Bilateral hallux nails are thickened and discolored with splitting noted on the left.  Musculoskeletal: MMT 5/5 bilateral lower extremities in DF, PF, Inversion and Eversion. Deceased ROM in DF of ankle joint.  Neurological: Sensation intact to light touch.   Assessment:   1. Onychomycosis       Plan:  Patient was evaluated and treated and all questions answered. -Examined patient -Discussed treatment options for painful dystrophic nails  -Culture was positive for t ruburm and trauma.  -Discussed use of urea nail gel. Discussed treatment options and pateint would like to try lamisil. Sent to pharmacy x 90 days  -Patient to return in 3 months.    Asberry Failing, DPM

## 2024-05-06 ENCOUNTER — Ambulatory Visit: Admitting: Podiatry

## 2024-05-15 ENCOUNTER — Ambulatory Visit: Admitting: Podiatry

## 2024-05-15 ENCOUNTER — Encounter: Payer: Self-pay | Admitting: Podiatry

## 2024-05-15 DIAGNOSIS — B351 Tinea unguium: Secondary | ICD-10-CM

## 2024-05-15 NOTE — Progress Notes (Signed)
"  °  Subjective:  Patient ID: Debbie Owens, female    DOB: [birth year],   MRN: [id number]  Chief Complaint  Patient presents with   Nail Problem    She put me on the Lamisil .  It really affected my stomach.  I took it for about five weeks.  I called the office about it and they told me to stop taking it.  I want to know if there's an alternative treatment.    69 y.o. female presents for follow-up fungal nails. AS above. . Denies any other pedal complaints. Denies n/v/f/c.   Past Medical History:  Diagnosis Date   Eczema    Graves disease    Urticaria     Objective:  Physical Exam: Vascular: DP/PT pulses 2/4 bilateral. CFT <3 seconds. Normal hair growth on digits. No edema.  Skin. No lacerations or abrasions bilateral feet. Bilateral hallux nails are thickened and discolored with splitting noted on the left.  Musculoskeletal: MMT 5/5 bilateral lower extremities in DF, PF, Inversion and Eversion. Deceased ROM in DF of ankle joint.  Neurological: Sensation intact to light touch.   Assessment:   1. Onychomycosis        Plan:  Patient was evaluated and treated and all questions answered. -Examined patient -Discussed treatment options for painful dystrophic nails  -Culture was positive for t ruburm and trauma.  -Discussed use of urea nail gel. Patient did not tolerate the lamisil . Will discontinue.  Will try laser treatments.  -Patient to return after laser treatments.    Asberry Failing, DPM    "

## 2024-07-30 ENCOUNTER — Ambulatory Visit

## 2024-08-27 ENCOUNTER — Ambulatory Visit

## 2024-09-24 ENCOUNTER — Ambulatory Visit

## 2024-11-05 ENCOUNTER — Ambulatory Visit
